# Patient Record
Sex: Male | Born: 1980 | Race: White | Hispanic: No | Smoking: Never smoker
Health system: Southern US, Community
[De-identification: ages and names within clinical notes are randomized; demographics above are authoritative.]

## PROBLEM LIST (undated history)

## (undated) DIAGNOSIS — F32A Depression, unspecified: Secondary | ICD-10-CM

## (undated) DIAGNOSIS — G4730 Sleep apnea, unspecified: Secondary | ICD-10-CM

## (undated) DIAGNOSIS — Z9889 Other specified postprocedural states: Secondary | ICD-10-CM

## (undated) DIAGNOSIS — R002 Palpitations: Secondary | ICD-10-CM

## (undated) HISTORY — PX: MICRODISCECTOMY LUMBAR: SUR864

---

## 2021-01-09 ENCOUNTER — Emergency Department (HOSPITAL_COMMUNITY): Payer: BC Managed Care – PPO | Admitting: Anesthesiology

## 2021-01-09 ENCOUNTER — Encounter (HOSPITAL_COMMUNITY): Admission: EM | Disposition: A | Discharge status: Home / Self Care | Payer: Self-pay | Source: Home / Self Care

## 2021-01-09 ENCOUNTER — Inpatient Hospital Stay: Admit: 2021-01-09 | Payer: Self-pay | Admitting: General Surgery

## 2021-01-09 ENCOUNTER — Emergency Department (HOSPITAL_BASED_OUTPATIENT_CLINIC_OR_DEPARTMENT_OTHER): Payer: BC Managed Care – PPO

## 2021-01-09 ENCOUNTER — Other Ambulatory Visit: Payer: Self-pay

## 2021-01-09 ENCOUNTER — Encounter (HOSPITAL_COMMUNITY): Payer: Self-pay | Admitting: Certified Registered"

## 2021-01-09 ENCOUNTER — Inpatient Hospital Stay (HOSPITAL_BASED_OUTPATIENT_CLINIC_OR_DEPARTMENT_OTHER)
Admission: EM | Admit: 2021-01-09 | Discharge: 2021-01-11 | DRG: 328 | Disposition: A | Discharge status: Home / Self Care | Payer: BC Managed Care – PPO | Attending: Surgery | Admitting: Surgery

## 2021-01-09 ENCOUNTER — Encounter (HOSPITAL_BASED_OUTPATIENT_CLINIC_OR_DEPARTMENT_OTHER): Payer: Self-pay

## 2021-01-09 ENCOUNTER — Inpatient Hospital Stay: Admit: 2021-01-09 | Payer: BC Managed Care – PPO | Admitting: General Surgery

## 2021-01-09 DIAGNOSIS — R109 Unspecified abdominal pain: Secondary | ICD-10-CM | POA: Diagnosis present

## 2021-01-09 DIAGNOSIS — Z79899 Other long term (current) drug therapy: Secondary | ICD-10-CM | POA: Diagnosis not present

## 2021-01-09 DIAGNOSIS — Z20822 Contact with and (suspected) exposure to covid-19: Secondary | ICD-10-CM | POA: Diagnosis present

## 2021-01-09 DIAGNOSIS — R112 Nausea with vomiting, unspecified: Secondary | ICD-10-CM

## 2021-01-09 DIAGNOSIS — K5900 Constipation, unspecified: Principal | ICD-10-CM | POA: Diagnosis present

## 2021-01-09 DIAGNOSIS — K562 Volvulus: Secondary | ICD-10-CM | POA: Diagnosis present

## 2021-01-09 DIAGNOSIS — R1084 Generalized abdominal pain: Secondary | ICD-10-CM

## 2021-01-09 HISTORY — PX: LAPAROSCOPY: SHX197

## 2021-01-09 HISTORY — DX: Other specified postprocedural states: Z98.890

## 2021-01-09 LAB — TYPE AND SCREEN
ABO/RH(D): A POS
Antibody Screen: NEGATIVE

## 2021-01-09 LAB — COMPREHENSIVE METABOLIC PANEL
ALT: 36 U/L (ref 0–44)
AST: 47 U/L — ABNORMAL HIGH (ref 15–41)
Albumin: 4.3 g/dL (ref 3.5–5.0)
Alkaline Phosphatase: 31 U/L — ABNORMAL LOW (ref 38–126)
Anion gap: 12 (ref 5–15)
BUN: 9 mg/dL (ref 6–20)
CO2: 27 mmol/L (ref 22–32)
Calcium: 10 mg/dL (ref 8.9–10.3)
Chloride: 98 mmol/L (ref 98–111)
Creatinine, Ser: 1.09 mg/dL (ref 0.61–1.24)
GFR, Estimated: >60 mL/min (ref >60)
Glucose, Bld: 90 mg/dL (ref 70–99)
Potassium: 4.4 mmol/L (ref 3.5–5.1)
Sodium: 137 mmol/L (ref 135–145)
Total Bilirubin: 0.9 mg/dL (ref 0.3–1.2)
Total Protein: 7.3 g/dL (ref 6.5–8.1)

## 2021-01-09 LAB — CBC
HCT: 51.3 % (ref 39.0–52.0)
HCT: 47.2 % (ref 39.0–52.0)
Hemoglobin: 17.4 g/dL — ABNORMAL HIGH (ref 13.0–17.0)
Hemoglobin: 16.2 g/dL (ref 13.0–17.0)
MCH: 31.8 pg (ref 26.0–34.0)
MCH: 32 pg (ref 26.0–34.0)
MCHC: 33.9 g/dL (ref 30.0–36.0)
MCHC: 34.3 g/dL (ref 30.0–36.0)
MCV: 93.6 fL (ref 80.0–100.0)
MCV: 93.3 fL (ref 80.0–100.0)
Platelets: 285 10*3/uL (ref 150–400)
Platelets: 283 10*3/uL (ref 150–400)
RBC: 5.48 MIL/uL (ref 4.22–5.81)
RBC: 5.06 MIL/uL (ref 4.22–5.81)
RDW: 14.4 % (ref 11.5–15.5)
RDW: 14.3 % (ref 11.5–15.5)
WBC: 9.6 10*3/uL (ref 4.0–10.5)
WBC: 12.5 10*3/uL — ABNORMAL HIGH (ref 4.0–10.5)
nRBC: 0 % (ref 0.0–0.2)
nRBC: 0 % (ref 0.0–0.2)

## 2021-01-09 LAB — CREATININE, SERUM
Creatinine, Ser: 1.17 mg/dL (ref 0.61–1.24)
GFR, Estimated: >60 mL/min (ref >60)

## 2021-01-09 LAB — RESP PANEL BY RT-PCR (FLU A&B, COVID) ARPGX2
Influenza A by PCR: NEGATIVE
Influenza B by PCR: NEGATIVE
SARS Coronavirus 2 by RT PCR: NEGATIVE

## 2021-01-09 LAB — PROTIME-INR
INR: 1 (ref 0.8–1.2)
Prothrombin Time: 13.1 seconds (ref 11.4–15.2)

## 2021-01-09 LAB — TROPONIN I (HIGH SENSITIVITY)
Troponin I (High Sensitivity): 14 ng/L (ref <18)
Troponin I (High Sensitivity): 13 ng/L (ref <18)

## 2021-01-09 LAB — APTT: aPTT: 26 seconds (ref 24–36)

## 2021-01-09 LAB — ABO/RH: ABO/RH(D): A POS

## 2021-01-09 LAB — LIPASE, BLOOD: Lipase: 37 U/L (ref 11–51)

## 2021-01-09 SURGERY — LAPAROTOMY, EXPLORATORY
Anesthesia: General

## 2021-01-09 SURGERY — LAPAROSCOPY, DIAGNOSTIC
Anesthesia: General

## 2021-01-09 MED ORDER — ACETAMINOPHEN 10 MG/ML IV SOLN
INTRAVENOUS | Status: AC
  Filled 2021-01-09: qty 100

## 2021-01-09 MED ORDER — HYDROMORPHONE HCL 1 MG/ML IJ SOLN
1.0000 mg | Freq: Once | INTRAMUSCULAR | Status: AC
  Administered 2021-01-09: 1 mg via INTRAVENOUS
  Filled 2021-01-09: qty 1

## 2021-01-09 MED ORDER — PROPOFOL 10 MG/ML IV BOLUS
INTRAVENOUS | Status: AC
  Filled 2021-01-09: qty 20

## 2021-01-09 MED ORDER — DOCUSATE SODIUM 100 MG PO CAPS
100.0000 mg | ORAL_CAPSULE | Freq: Two times a day (BID) | ORAL | Status: DC
  Administered 2021-01-09 – 2021-01-11 (×4): 100 mg via ORAL
  Filled 2021-01-09 (×4): qty 1

## 2021-01-09 MED ORDER — CEFAZOLIN SODIUM-DEXTROSE 2-3 GM-%(50ML) IV SOLR
INTRAVENOUS | Status: DC | PRN
  Administered 2021-01-09: 2 g via INTRAVENOUS

## 2021-01-09 MED ORDER — FLEET ENEMA 7-19 GM/118ML RE ENEM: 1.0000 | ENEMA | Freq: Once | RECTAL | Status: DC | PRN

## 2021-01-09 MED ORDER — HYDROMORPHONE HCL 1 MG/ML IJ SOLN
INTRAMUSCULAR | Status: AC
  Filled 2021-01-09: qty 1

## 2021-01-09 MED ORDER — LIDOCAINE 2% (20 MG/ML) 5 ML SYRINGE
INTRAMUSCULAR | Status: DC | PRN
  Administered 2021-01-09: 30 mg via INTRAVENOUS

## 2021-01-09 MED ORDER — CEFAZOLIN SODIUM-DEXTROSE 2-4 GM/100ML-% IV SOLN
2.0000 g | Freq: Three times a day (TID) | INTRAVENOUS | Status: AC
  Administered 2021-01-09: 2 g via INTRAVENOUS
  Filled 2021-01-09: qty 100

## 2021-01-09 MED ORDER — POLYETHYLENE GLYCOL 3350 17 G PO PACK
17.0000 g | PACK | Freq: Two times a day (BID) | ORAL | Status: DC
  Administered 2021-01-09 – 2021-01-11 (×4): 17 g via ORAL
  Filled 2021-01-09 (×4): qty 1

## 2021-01-09 MED ORDER — HYDROMORPHONE HCL 1 MG/ML IJ SOLN
0.2500 mg | INTRAMUSCULAR | Status: DC | PRN
  Administered 2021-01-09 (×3): 0.5 mg via INTRAVENOUS

## 2021-01-09 MED ORDER — MIDAZOLAM HCL 2 MG/2ML IJ SOLN
INTRAMUSCULAR | Status: AC
  Filled 2021-01-09: qty 2

## 2021-01-09 MED ORDER — ROCURONIUM BROMIDE 10 MG/ML (PF) SYRINGE
PREFILLED_SYRINGE | INTRAVENOUS | Status: DC | PRN
Start: 2021-01-09 — End: 2021-01-09
  Administered 2021-01-09: 70 mg via INTRAVENOUS

## 2021-01-09 MED ORDER — ONDANSETRON HCL 4 MG/2ML IJ SOLN: 4.0000 mg | Freq: Four times a day (QID) | INTRAMUSCULAR | Status: DC | PRN

## 2021-01-09 MED ORDER — BUPIVACAINE-EPINEPHRINE (PF) 0.25% -1:200000 IJ SOLN
INTRAMUSCULAR | Status: AC
  Filled 2021-01-09: qty 30

## 2021-01-09 MED ORDER — CHLORHEXIDINE GLUCONATE 0.12 % MT SOLN
OROMUCOSAL | Status: AC
  Administered 2021-01-09: 15 mL via OROMUCOSAL
  Filled 2021-01-09: qty 15

## 2021-01-09 MED ORDER — LACTATED RINGERS IV SOLN: INTRAVENOUS | Status: DC

## 2021-01-09 MED ORDER — DIPHENHYDRAMINE HCL 12.5 MG/5ML PO ELIX: 12.5000 mg | ORAL_SOLUTION | Freq: Four times a day (QID) | ORAL | Status: DC | PRN

## 2021-01-09 MED ORDER — ENOXAPARIN SODIUM 40 MG/0.4ML IJ SOSY
40.0000 mg | PREFILLED_SYRINGE | INTRAMUSCULAR | Status: DC
  Administered 2021-01-10 – 2021-01-11 (×2): 40 mg via SUBCUTANEOUS
  Filled 2021-01-09 (×2): qty 0.4

## 2021-01-09 MED ORDER — KETOROLAC TROMETHAMINE 30 MG/ML IJ SOLN
30.0000 mg | Freq: Four times a day (QID) | INTRAMUSCULAR | Status: AC
  Administered 2021-01-09 – 2021-01-10 (×4): 30 mg via INTRAVENOUS
  Filled 2021-01-09 (×4): qty 1

## 2021-01-09 MED ORDER — ONDANSETRON HCL 4 MG/2ML IJ SOLN
INTRAMUSCULAR | Status: DC | PRN
  Administered 2021-01-09: 4 mg via INTRAVENOUS

## 2021-01-09 MED ORDER — PROCHLORPERAZINE MALEATE 10 MG PO TABS
10.0000 mg | ORAL_TABLET | Freq: Four times a day (QID) | ORAL | Status: DC | PRN
  Filled 2021-01-09: qty 1

## 2021-01-09 MED ORDER — FENTANYL CITRATE (PF) 100 MCG/2ML IJ SOLN
INTRAMUSCULAR | Status: AC
  Filled 2021-01-09: qty 2

## 2021-01-09 MED ORDER — ONDANSETRON 4 MG PO TBDP: 4.0000 mg | ORAL_TABLET | Freq: Four times a day (QID) | ORAL | Status: DC | PRN

## 2021-01-09 MED ORDER — FENTANYL CITRATE (PF) 100 MCG/2ML IJ SOLN
100.0000 ug | Freq: Once | INTRAMUSCULAR | Status: AC
  Administered 2021-01-09: 100 ug via INTRAVENOUS
  Filled 2021-01-09: qty 2

## 2021-01-09 MED ORDER — PHENYLEPHRINE 40 MCG/ML (10ML) SYRINGE FOR IV PUSH (FOR BLOOD PRESSURE SUPPORT)
PREFILLED_SYRINGE | INTRAVENOUS | Status: DC | PRN
Start: 2021-01-09 — End: 2021-01-09
  Administered 2021-01-09: 80 ug via INTRAVENOUS
  Administered 2021-01-09: 120 ug via INTRAVENOUS
  Administered 2021-01-09 (×2): 80 ug via INTRAVENOUS

## 2021-01-09 MED ORDER — KETOROLAC TROMETHAMINE 30 MG/ML IJ SOLN
30.0000 mg | Freq: Four times a day (QID) | INTRAMUSCULAR | Status: DC | PRN
Start: 2021-01-10 — End: 2021-01-11
  Administered 2021-01-10 – 2021-01-11 (×2): 30 mg via INTRAVENOUS
  Filled 2021-01-09 (×2): qty 1

## 2021-01-09 MED ORDER — SODIUM CHLORIDE 0.9 % IV BOLUS
1000.0000 mL | Freq: Once | INTRAVENOUS | Status: AC
  Administered 2021-01-09: 1000 mL via INTRAVENOUS

## 2021-01-09 MED ORDER — DIPHENHYDRAMINE HCL 50 MG/ML IJ SOLN: 12.5000 mg | Freq: Four times a day (QID) | INTRAMUSCULAR | Status: DC | PRN

## 2021-01-09 MED ORDER — ACETAMINOPHEN 650 MG RE SUPP: 650.0000 mg | Freq: Four times a day (QID) | RECTAL | Status: DC | PRN

## 2021-01-09 MED ORDER — MIDAZOLAM HCL 2 MG/2ML IJ SOLN
INTRAMUSCULAR | Status: DC | PRN
Start: 2021-01-09 — End: 2021-01-09
  Administered 2021-01-09: 2 mg via INTRAVENOUS

## 2021-01-09 MED ORDER — SUCCINYLCHOLINE CHLORIDE 200 MG/10ML IV SOSY
PREFILLED_SYRINGE | INTRAVENOUS | Status: DC | PRN
Start: 2021-01-09 — End: 2021-01-09
  Administered 2021-01-09: 120 mg via INTRAVENOUS

## 2021-01-09 MED ORDER — IOHEXOL 300 MG/ML  SOLN
100.0000 mL | Freq: Once | INTRAMUSCULAR | Status: AC | PRN
  Administered 2021-01-09: 100 mL via INTRAVENOUS

## 2021-01-09 MED ORDER — DEXAMETHASONE SODIUM PHOSPHATE 10 MG/ML IJ SOLN
INTRAMUSCULAR | Status: DC | PRN
  Administered 2021-01-09: 10 mg via INTRAVENOUS

## 2021-01-09 MED ORDER — METHOCARBAMOL 500 MG PO TABS: 500.0000 mg | ORAL_TABLET | Freq: Four times a day (QID) | ORAL | Status: DC | PRN

## 2021-01-09 MED ORDER — PROCHLORPERAZINE EDISYLATE 10 MG/2ML IJ SOLN: 5.0000 mg | Freq: Four times a day (QID) | INTRAMUSCULAR | Status: DC | PRN

## 2021-01-09 MED ORDER — LIDOCAINE HCL 1 % IJ SOLN
INTRAMUSCULAR | Status: DC | PRN
  Administered 2021-01-09: 9 mL via INTRAMUSCULAR

## 2021-01-09 MED ORDER — PROPOFOL 10 MG/ML IV BOLUS
INTRAVENOUS | Status: DC | PRN
Start: 2021-01-09 — End: 2021-01-09
  Administered 2021-01-09: 200 mg via INTRAVENOUS

## 2021-01-09 MED ORDER — OXYCODONE HCL 5 MG PO TABS
5.0000 mg | ORAL_TABLET | ORAL | Status: DC | PRN
  Administered 2021-01-10: 5 mg via ORAL
  Filled 2021-01-09: qty 1

## 2021-01-09 MED ORDER — ONDANSETRON HCL 4 MG/2ML IJ SOLN
4.0000 mg | Freq: Once | INTRAMUSCULAR | Status: AC
  Administered 2021-01-09: 4 mg via INTRAVENOUS
  Filled 2021-01-09: qty 2

## 2021-01-09 MED ORDER — CEFAZOLIN SODIUM-DEXTROSE 2-4 GM/100ML-% IV SOLN
INTRAVENOUS | Status: AC
  Filled 2021-01-09: qty 100

## 2021-01-09 MED ORDER — FENTANYL CITRATE (PF) 250 MCG/5ML IJ SOLN
INTRAMUSCULAR | Status: AC
  Filled 2021-01-09: qty 5

## 2021-01-09 MED ORDER — PHENYLEPHRINE 40 MCG/ML (10ML) SYRINGE FOR IV PUSH (FOR BLOOD PRESSURE SUPPORT)
PREFILLED_SYRINGE | INTRAVENOUS | Status: AC
  Filled 2021-01-09: qty 10

## 2021-01-09 MED ORDER — TRAMADOL HCL 50 MG PO TABS
50.0000 mg | ORAL_TABLET | Freq: Four times a day (QID) | ORAL | Status: DC | PRN
  Administered 2021-01-10: 50 mg via ORAL
  Filled 2021-01-09 (×2): qty 1

## 2021-01-09 MED ORDER — MELATONIN 3 MG PO TABS: 3.0000 mg | ORAL_TABLET | Freq: Every evening | ORAL | Status: DC | PRN

## 2021-01-09 MED ORDER — ACETAMINOPHEN 10 MG/ML IV SOLN
INTRAVENOUS | Status: DC | PRN
  Administered 2021-01-09: 1000 mg via INTRAVENOUS

## 2021-01-09 MED ORDER — FENTANYL CITRATE PF 50 MCG/ML IJ SOSY
50.0000 ug | PREFILLED_SYRINGE | INTRAMUSCULAR | Status: DC | PRN
  Administered 2021-01-09: 50 ug via INTRAVENOUS
  Filled 2021-01-09: qty 1

## 2021-01-09 MED ORDER — 0.9 % SODIUM CHLORIDE (POUR BTL) OPTIME
TOPICAL | Status: DC | PRN
  Administered 2021-01-09: 1000 mL

## 2021-01-09 MED ORDER — KCL-LACTATED RINGERS-D5W 20 MEQ/L IV SOLN
INTRAVENOUS | Status: DC
  Filled 2021-01-09 (×2): qty 1000

## 2021-01-09 MED ORDER — FENTANYL CITRATE (PF) 250 MCG/5ML IJ SOLN
INTRAMUSCULAR | Status: DC | PRN
  Administered 2021-01-09 (×2): 100 ug via INTRAVENOUS

## 2021-01-09 MED ORDER — ORAL CARE MOUTH RINSE: 15.0000 mL | Freq: Once | OROMUCOSAL | Status: AC

## 2021-01-09 MED ORDER — CHLORHEXIDINE GLUCONATE 0.12 % MT SOLN: 15.0000 mL | Freq: Once | OROMUCOSAL | Status: AC

## 2021-01-09 MED ORDER — MORPHINE SULFATE (PF) 4 MG/ML IV SOLN
4.0000 mg | Freq: Once | INTRAVENOUS | Status: AC
  Administered 2021-01-09: 4 mg via INTRAVENOUS
  Filled 2021-01-09: qty 1

## 2021-01-09 MED ORDER — ACETAMINOPHEN 325 MG PO TABS: 650.0000 mg | ORAL_TABLET | Freq: Four times a day (QID) | ORAL | Status: DC | PRN

## 2021-01-09 MED ORDER — FENTANYL CITRATE PF 50 MCG/ML IJ SOSY
50.0000 ug | PREFILLED_SYRINGE | Freq: Once | INTRAMUSCULAR | Status: AC
  Administered 2021-01-09: 50 ug via INTRAVENOUS
  Filled 2021-01-09: qty 1

## 2021-01-09 MED ORDER — SUGAMMADEX SODIUM 200 MG/2ML IV SOLN
INTRAVENOUS | Status: DC | PRN
Start: 2021-01-09 — End: 2021-01-09
  Administered 2021-01-09: 200 mg via INTRAVENOUS

## 2021-01-09 MED ORDER — SIMETHICONE 80 MG PO CHEW: 40.0000 mg | CHEWABLE_TABLET | Freq: Four times a day (QID) | ORAL | Status: DC | PRN

## 2021-01-09 MED ORDER — LIDOCAINE HCL (PF) 1 % IJ SOLN
INTRAMUSCULAR | Status: AC
  Filled 2021-01-09: qty 30

## 2021-01-09 SURGICAL SUPPLY — 58 items
BAG COUNTER SPONGE SURGICOUNT (BAG) ×2 IMPLANT
BLADE CLIPPER SURG (BLADE) IMPLANT
CANISTER SUCT 3000ML PPV (MISCELLANEOUS) ×2 IMPLANT
CHLORAPREP W/TINT 26 (MISCELLANEOUS) ×2 IMPLANT
COVER SURGICAL LIGHT HANDLE (MISCELLANEOUS) ×2 IMPLANT
DECANTER SPIKE VIAL GLASS SM (MISCELLANEOUS) ×4 IMPLANT
DERMABOND ADVANCED (GAUZE/BANDAGES/DRESSINGS) ×1
DERMABOND ADVANCED .7 DNX12 (GAUZE/BANDAGES/DRESSINGS) ×1 IMPLANT
DRAPE LAPAROSCOPIC ABDOMINAL (DRAPES) ×2 IMPLANT
DRAPE WARM FLUID 44X44 (DRAPES) ×2 IMPLANT
DRSG OPSITE POSTOP 4X10 (GAUZE/BANDAGES/DRESSINGS) IMPLANT
DRSG OPSITE POSTOP 4X8 (GAUZE/BANDAGES/DRESSINGS) IMPLANT
ELECT BLADE 6.5 EXT (BLADE) IMPLANT
ELECT CAUTERY BLADE 6.4 (BLADE) ×5 IMPLANT
ELECT REM PT RETURN 9FT ADLT (ELECTROSURGICAL) ×2
ELECTRODE REM PT RTRN 9FT ADLT (ELECTROSURGICAL) ×1 IMPLANT
GAUZE 4X4 16PLY ~~LOC~~+RFID DBL (SPONGE) ×1 IMPLANT
GLOVE SURG ENC MOIS LTX SZ6 (GLOVE) ×4 IMPLANT
GLOVE SURG UNDER LTX SZ6.5 (GLOVE) ×2 IMPLANT
GOWN STRL REUS W/ TWL LRG LVL3 (GOWN DISPOSABLE) ×2 IMPLANT
GOWN STRL REUS W/TWL 2XL LVL3 (GOWN DISPOSABLE) ×4 IMPLANT
GOWN STRL REUS W/TWL LRG LVL3 (GOWN DISPOSABLE) ×2
HANDLE SUCTION POOLE (INSTRUMENTS) ×1 IMPLANT
KIT BASIN OR (CUSTOM PROCEDURE TRAY) ×2 IMPLANT
KIT TURNOVER KIT B (KITS) ×2 IMPLANT
L-HOOK LAP DISP 36CM (ELECTROSURGICAL) ×2
LHOOK LAP DISP 36CM (ELECTROSURGICAL) ×1 IMPLANT
LIGASURE IMPACT 36 18CM CVD LR (INSTRUMENTS) IMPLANT
NS IRRIG 1000ML POUR BTL (IV SOLUTION) ×4 IMPLANT
PACK GENERAL/GYN (CUSTOM PROCEDURE TRAY) ×2 IMPLANT
PAD ARMBOARD 7.5X6 YLW CONV (MISCELLANEOUS) ×4 IMPLANT
PENCIL BUTTON HOLSTER BLD 10FT (ELECTRODE) ×2 IMPLANT
PENCIL SMOKE EVACUATOR (MISCELLANEOUS) ×2 IMPLANT
SCISSORS LAP 5X35 DISP (ENDOMECHANICALS) IMPLANT
SET IRRIG TUBING LAPAROSCOPIC (IRRIGATION / IRRIGATOR) IMPLANT
SET TUBE SMOKE EVAC HIGH FLOW (TUBING) ×2 IMPLANT
SLEEVE ENDOPATH XCEL 5M (ENDOMECHANICALS) ×2 IMPLANT
SPECIMEN JAR LARGE (MISCELLANEOUS) ×2 IMPLANT
SPONGE T-LAP 18X18 ~~LOC~~+RFID (SPONGE) ×3 IMPLANT
STAPLER VISISTAT 35W (STAPLE) ×2 IMPLANT
SUCTION POOLE HANDLE (INSTRUMENTS) ×2
SUT MNCRL AB 4-0 PS2 18 (SUTURE) ×2 IMPLANT
SUT PDS AB 1 TP1 96 (SUTURE) ×4 IMPLANT
SUT PDS II 0 TP-1 LOOPED 60 (SUTURE) ×4 IMPLANT
SUT VIC AB 2-0 SH 18 (SUTURE) ×2 IMPLANT
SUT VIC AB 3-0 SH 18 (SUTURE) ×2 IMPLANT
SUT VICRYL 4-0 PS2 18IN ABS (SUTURE) IMPLANT
SUT VICRYL AB 2 0 TIES (SUTURE) ×2 IMPLANT
SUT VICRYL AB 3 0 TIES (SUTURE) ×2 IMPLANT
TOWEL GREEN STERILE (TOWEL DISPOSABLE) ×2 IMPLANT
TOWEL GREEN STERILE FF (TOWEL DISPOSABLE) ×2 IMPLANT
TRAY FOLEY MTR SLVR 16FR STAT (SET/KITS/TRAYS/PACK) IMPLANT
TRAY LAPAROSCOPIC MC (CUSTOM PROCEDURE TRAY) ×2 IMPLANT
TROCAR XCEL BLUNT TIP 100MML (ENDOMECHANICALS) IMPLANT
TROCAR XCEL NON-BLD 11X100MML (ENDOMECHANICALS) IMPLANT
TROCAR XCEL NON-BLD 5MMX100MML (ENDOMECHANICALS) ×2 IMPLANT
WARMER LAPAROSCOPE (MISCELLANEOUS) ×2 IMPLANT
YANKAUER SUCT BULB TIP NO VENT (SUCTIONS) ×2 IMPLANT

## 2021-01-09 NOTE — Anesthesia Preprocedure Evaluation (Addendum)
Anesthesia Evaluation  Patient identified by MRN, date of birth, ID band Patient awake    Reviewed: Allergy & Precautions, H&P , NPO status , Patient's Chart, lab work & pertinent test results  Airway Mallampati: II  TM Distance: >3 FB Neck ROM: Full    Dental no notable dental hx. (+) Teeth Intact, Dental Advisory Given   Pulmonary neg pulmonary ROS,    Pulmonary exam normal breath sounds clear to auscultation       Cardiovascular negative cardio ROS   Rhythm:Regular Rate:Normal     Neuro/Psych negative neurological ROS  negative psych ROS   GI/Hepatic negative GI ROS, Neg liver ROS,   Endo/Other  negative endocrine ROS  Renal/GU negative Renal ROS  negative genitourinary   Musculoskeletal   Abdominal   Peds  Hematology negative hematology ROS (+)   Anesthesia Other Findings   Reproductive/Obstetrics negative OB ROS                            Anesthesia Physical Anesthesia Plan  ASA: 1 and emergent  Anesthesia Plan: General   Post-op Pain Management: Ofirmev IV (intra-op)   Induction: Intravenous, Rapid sequence and Cricoid pressure planned  PONV Risk Score and Plan: 3 and Ondansetron, Dexamethasone and Midazolam  Airway Management Planned: Oral ETT  Additional Equipment:   Intra-op Plan:   Post-operative Plan: Extubation in OR  Informed Consent: I have reviewed the patients History and Physical, chart, labs and discussed the procedure including the risks, benefits and alternatives for the proposed anesthesia with the patient or authorized representative who has indicated his/her understanding and acceptance.     Dental advisory given  Plan Discussed with: CRNA  Anesthesia Plan Comments:        Anesthesia Quick Evaluation

## 2021-01-09 NOTE — Op Note (Signed)
PRE-OPERATIVE DIAGNOSIS: small bowel volvulus  POST-OPERATIVE DIAGNOSIS:  abdominal pain with nausea and vomiting  PROCEDURE:  Procedure(s): Diagnostic laparoscopy  SURGEON:  Surgeon(s): Almond Lint, MD  ANESTHESIA:   local and general  DRAINS: none   LOCAL MEDICATIONS USED:  BUPIVICAINE  and LIDOCAINE   SPECIMEN:  No Specimen  DISPOSITION OF SPECIMEN:  N/A  COUNTS:  YES  DICTATION: .Dragon Dictation  PLAN OF CARE: Admit for overnight observation  PATIENT DISPOSITION:  PACU - hemodynamically stable.  FINDINGS:  some larger caliber bowel and some smaller caliber bowel.  No volvulus seen  EBL: min  PROCEDURE:   Patient was identified in the holding area.  He was taken to the operating room and placed supine on the operating room table.  General anesthesia was induced.  A Foley catheter was placed.  His abdomen was prepped and draped in sterile fashion.  A timeout was performed according to the surgical safety checklist.  When all was correct, we continued.  The patient was then placed into reverse Trendelenburg position and rotated to the right.  Local anesthetic was administered at the left costal margin and a 5 mm Optiview port was placed under direct visualization.  Pneumoperitoneum was achieved to a pressure of 15 mmHg.  Reverse Trendelenburg was taken out but the patient was left rotated to the right.  2 additional ports were placed in the far left lateral abdomen with 1 being central and 1 in the left lower quadrant.  The patient was placed into Trendelenburg position and rotated to the left.  Starting at the terminal ileum, the small bowel was run from distal to proximal.  There were no significant adhesions other than right at the terminal ileum as expected.  There is no evidence of a Meckel's diverticulum or small bowel mass.  No intussusception was seen.  There were several areas where the bowel appeared to have a larger caliber and smaller caliber diameter near to each  other.  However, there was no evidence of significant twisting like was seen on the CT scan.  The bowel was run again this time from proximal to distal.  There was again no evidence of malrotation.  There is no evidence of volvulus.  There again were a few areas with changes of caliber in the bowel.  The distal bowel was dilated.  There did appear to be more pastelike material in the distal small intestine as opposed to pure liquid.  There were no other obvious causes of abdominal pain.  The external portions of the colon which were visible were normal.  The liver appeared normal.  Stomach appeared normal.  There was no evidence of obvious abnormality in the pelvis.  A four-quadrant inspection was then performed taking care to look for any evidence of bilious leakage from the small bowel, bleeding, or other evidence of injury.  None was seen.  The pneumoperitoneum was released.  The skin of the incisions was then closed using 4-0 Monocryl in subcuticular fashion.  This was then cleaned, dried, and dressed with Dermabond.  Patient was then allowed to emerge from anesthesia.  He was taken to the PACU in stable condition.  Needle, sponge, and instrument counts were correct per protocol.

## 2021-01-09 NOTE — ED Triage Notes (Signed)
He c/o generalized, worsening abd. Pain and constipation x 2-3 days. He denies fever/vomiting and is in no distress. He does grimace, as if in much pain.

## 2021-01-09 NOTE — ED Provider Notes (Addendum)
[facility] EMERGENCY DEPT Provider Note   CSN: [id number] Arrival date & time: 01/09/21  0825     History  Chief Complaint  Patient presents with   Abdominal Pain    Bruce Morton is a 41 y.o. male.  Patient is a 41 yo male presenting for abdominal pain. Pt admits to abdominal pain that is generalized and severe. Awoke him from sleep. Non radiating. Associated with n/v. Denies rectal bleeding. Denies fevers, chills, recent illness, diarrhea. Denies chest pain or sob.   Hx of back surgeries. Denies intraabdominal surgeries.   Abdominal Pain Associated symptoms: nausea and vomiting   Associated symptoms: no chest pain, no chills, no cough, no dysuria, no fever, no hematuria, no shortness of breath and no sore throat       Home Medications Prior to Admission medications   Not on File      Allergies    Patient has no known allergies.    Review of Systems   Review of Systems  Constitutional:  Negative for chills and fever.  HENT:  Negative for ear pain and sore throat.   Eyes:  Negative for pain and visual disturbance.  Respiratory:  Negative for cough and shortness of breath.   Cardiovascular:  Negative for chest pain and palpitations.  Gastrointestinal:  Positive for abdominal pain, nausea and vomiting.  Genitourinary:  Negative for dysuria and hematuria.  Musculoskeletal:  Negative for arthralgias and back pain.  Skin:  Negative for color change and rash.  Neurological:  Negative for seizures and syncope.  All other systems reviewed and are negative.  Physical Exam Updated Vital Signs BP 136/90 (BP Location: Right Arm)    Pulse 90    Temp 98.5 F (36.9 C) (Oral)    Resp 13    Ht 5\' 9"  (1.753 m)    Wt 90.7 kg    SpO2 99%    BMI 29.53 kg/m  Physical Exam Vitals and nursing note reviewed.  Constitutional:      General: He is in acute distress (secondary to pain).     Appearance: He is well-developed.  HENT:     Head: Normocephalic and  atraumatic.  Eyes:     Conjunctiva/sclera: Conjunctivae normal.  Cardiovascular:     Rate and Rhythm: Normal rate and regular rhythm.     Heart sounds: No murmur heard. Pulmonary:     Effort: Pulmonary effort is normal. No respiratory distress.     Breath sounds: Normal breath sounds.  Abdominal:     Palpations: Abdomen is soft.     Tenderness: There is generalized abdominal tenderness. There is guarding.  Musculoskeletal:        General: No swelling.     Cervical back: Neck supple.  Skin:    General: Skin is warm and dry.     Capillary Refill: Capillary refill takes less than 2 seconds.  Neurological:     Mental Status: He is alert.  Psychiatric:        Mood and Affect: Mood normal.    ED Results / Procedures / Treatments   Labs (all labs ordered are listed, but only abnormal results are displayed) Labs Reviewed  LIPASE, BLOOD  COMPREHENSIVE METABOLIC PANEL  CBC  URINALYSIS, ROUTINE W REFLEX MICROSCOPIC    EKG None  Radiology No results found.  Procedures .Critical Care Performed by: Lianne Cure, DO Authorized by: Lianne Cure, DO   Critical care provider statement:    Critical care time (minutes):  36  Critical care was necessary to treat or prevent imminent or life-threatening deterioration of the following conditions: midgut volvulous, transfer to [facility] OR for surgery.   Critical care was time spent personally by me on the following activities:  Development of treatment plan with patient or surrogate, discussions with consultants, evaluation of patient's response to treatment, examination of patient, ordering and review of laboratory studies, ordering and review of radiographic studies, ordering and performing treatments and interventions, pulse oximetry, re-evaluation of patient's condition and review of old charts Comments:     General surgery    Medications Ordered in ED Medications  ondansetron (ZOFRAN) injection 4 mg (has no administration in time  range)  morphine 4 MG/ML injection 4 mg (has no administration in time range)  sodium chloride 0.9 % bolus 1,000 mL (has no administration in time range)    ED Course/ Medical Decision Making/ A&P                           Medical Decision Making  9:00 AM 41 yo male presenting for severe generalized abdominal pain, nausea, vomiting, that awoke him from sleep. On exam abdomen is tender in all quadrants with guarding. Morphine 4 mg and zofran given. IVF given.  Worsening pain. Dilaudid given.   No s/s sepsis Stable labs CT demonstrates midgut volvulus. I spoke with general surgery at [facility] who recs ED to ED transfer for evaluation  Patient requiring multiple doses of IV narcotics for pain control. Repeat dilaudid and fentanyl given.  10:48 AM Patient accepted by ED physician Dr. Laverta Baltimore. Ambulance called.   10:58 AM Call back from surgery. Requesting patient be taken straight to OR Pt up to date Type and screen, coags ordered    Final Clinical Impression(s) / ED Diagnoses Final diagnoses:  Volvulus (Will)  Generalized abdominal pain  Nausea and vomiting, unspecified vomiting type    Rx / DC Orders ED Discharge Orders     None         Lianne Cure, DO [id number]    Campbell Stall P, DO [id number]    Campbell Stall P, DO [id number]

## 2021-01-09 NOTE — H&P (Signed)
Bruce Morton is an 41 y.o. male.   Chief Complaint: Abdominal pain  HPI:  Pt is a 41 yo M who went to [facility] with sudden onset of abdominal pain that woke him from sleep.  He had no previous illnesses.  He has never had abdominal surgery.  The pain was associated with significant nausea and vomiting.    Pain was generalized to the entire abdomen.  He also had no fevers or chills.  He tried changing positions which was not successful.  He has had issues with constipation.  He is taking miralax and fiber supplements.  He took a large dose of fiber supplements yesterday. He denies diarrhea.  Due to the recent constipation, he tried an enema to see if having a BM would help.    Past Medical History:  Diagnosis Date   H/O nasal septoplasty     Past Surgical History:  Procedure Laterality Date   MICRODISCECTOMY LUMBAR      History reviewed. No pertinent family history. Social History:  reports that he has never smoked. He has never used smokeless tobacco. No history on file for alcohol use and drug use.  Allergies: No Known Allergies  Medications Prior to Admission  Medication Sig Dispense Refill   Cholecalciferol (VITAMIN D3 PO) Take 1 Dose by mouth daily.     Cyanocobalamin (VITAMIN B12 PO) Take 1 Dose by mouth daily.     icosapent Ethyl (VASCEPA) 1 g capsule Take 2 g by mouth 2 (two) times daily.     Multiple Vitamin (MULTIVITAMIN) tablet Take 1 tablet by mouth daily.     OVER THE COUNTER MEDICATION Take 1 Dose by mouth daily. Fiberlyze     TRAZODONE HCL PO Take 1 tablet by mouth at bedtime as needed (sleep).     TRETINOIN EX Apply 1 application topically at bedtime. Apply to face      Results for orders placed or performed during the hospital encounter of 01/09/21 (from the past 48 hour(s))  Lipase, blood     Status: None   Collection Time: 01/09/21  8:47 AM  Result Value Ref Range   Lipase 37 11 - 51 U/L    Comment: Performed at [facility], [address], [place], [place] [address]  Comprehensive metabolic panel     Status: Abnormal   Collection Time: 01/09/21  8:47 AM  Result Value Ref Range   Sodium 137 135 - 145 mmol/L   Potassium 4.4 3.5 - 5.1 mmol/L   Chloride 98 98 - 111 mmol/L   CO2 27 22 - 32 mmol/L   Glucose, Bld 90 70 - 99 mg/dL    Comment: Glucose reference range applies only to samples taken after fasting for at least 8 hours.   BUN 9 6 - 20 mg/dL   Creatinine, Ser 2.80 0.61 - 1.24 mg/dL   Calcium 03.4 8.9 - 91.7 mg/dL   Total Protein 7.3 6.5 - 8.1 g/dL   Albumin 4.3 3.5 - 5.0 g/dL   AST 47 (H) 15 - 41 U/L   ALT 36 0 - 44 U/L   Alkaline Phosphatase 31 (L) 38 - 126 U/L   Total Bilirubin 0.9 0.3 - 1.2 mg/dL   GFR, Estimated >91 >50 mL/min    Comment: (NOTE) Calculated using the CKD-EPI Creatinine Equation (2021)    Anion gap 12 5 - 15    Comment: Performed at [facility], [address] [place], [place] [address]  CBC  Status: Abnormal   Collection Time: 01/09/21  8:47 AM  Result Value Ref Range   WBC 9.6 4.0 - 10.5 K/uL   RBC 5.48 4.22 - 5.81 MIL/uL   Hemoglobin 17.4 (H) 13.0 - 17.0 g/dL   HCT 13.251.3 44.039.0 - 10.252.0 %   MCV 93.6 80.0 - 100.0 fL   MCH 31.8 26.0 - 34.0 pg   MCHC 33.9 30.0 - 36.0 g/dL   RDW 72.514.4 36.611.5 - 44.015.5 %   Platelets 285 150 - 400 K/uL   nRBC 0.0 0.0 - 0.2 %    Comment: Performed at [facility], [address], [place], [place] [address]  Troponin I (High Sensitivity)     Status: None   Collection Time: 01/09/21  9:00 AM  Result Value Ref Range   Troponin I (High Sensitivity) 14 <18 ng/L    Comment: (NOTE) Elevated high sensitivity troponin I (hsTnI) values and significant  changes across serial measurements may suggest ACS but many other  chronic and acute conditions are known to elevate hsTnI results.  Refer to the "Links" section for chest pain algorithms and additional  guidance. Performed at [facility], [address], [place], [place] [address]   Troponin I (High Sensitivity)     Status: None   Collection Time: 01/09/21 10:55 AM  Result Value Ref Range   Troponin I (High Sensitivity) 13 <18 ng/L    Comment: (NOTE) Elevated high sensitivity troponin I (hsTnI) values and significant  changes across serial measurements may suggest ACS but many other  chronic and acute conditions are known to elevate hsTnI results.  Refer to the "Links" section for chest pain algorithms and additional  guidance. Performed at [facility], [address], [place], [place] [address]   Protime-INR     Status: None   Collection Time: 01/09/21 11:06 AM  Result Value Ref Range   Prothrombin Time 13.1 11.4 - 15.2 seconds   INR 1.0 0.8 - 1.2    Comment: (NOTE) INR goal varies based on device and disease states. Performed at [facility], [address], [place], [place] [address]   APTT     Status: None   Collection Time: 01/09/21 11:06 AM  Result Value Ref Range   aPTT 26 24 - 36 seconds    Comment: Performed at [facility], [address], [place], [place] [address]  Resp Panel by RT-PCR (Flu A&B, Covid) Nasopharyngeal Swab     Status: None   Collection Time: 01/09/21 11:24 AM   Specimen: Nasopharyngeal Swab; Nasopharyngeal(NP) swabs in vial transport medium  Result Value Ref Range   SARS Coronavirus 2 by RT PCR NEGATIVE NEGATIVE    Comment: (NOTE) SARS-CoV-2 target nucleic acids are NOT DETECTED.  The SARS-CoV-2 RNA is generally detectable in upper respiratory specimens during the acute phase of infection. The lowest concentration of SARS-CoV-2 viral copies this assay can detect is 138 copies/mL. A negative result does not preclude SARS-Cov-2 infection and should not be used as the sole basis for treatment or other patient management decisions. A negative result may occur with  improper specimen collection/handling,  submission of specimen other than nasopharyngeal swab, presence of viral mutation(s) within the areas targeted by this assay, and inadequate number of viral copies(<138 copies/mL). A negative result must be combined with clinical observations, patient history, and epidemiological information. The expected result is Negative.  Fact Sheet for Patients:  BloggerCourse.comhttps://www.fda.gov/media/152166/download  Fact Sheet for Healthcare Providers:  SeriousBroker.ithttps://www.fda.gov/media/152162/download  This test  is no t yet approved or cleared by the [place] FDA and  has been authorized for detection and/or diagnosis of SARS-CoV-2 by FDA under an Emergency Use Authorization (EUA). This EUA will remain  in effect (meaning this test can be used) for the duration of the COVID-19 declaration under Section 564(b)(1) of the Act, 21 U.S.C.section 360bbb-3(b)(1), unless the authorization is terminated  or revoked sooner.       Influenza A by PCR NEGATIVE NEGATIVE   Influenza B by PCR NEGATIVE NEGATIVE    Comment: (NOTE) The Xpert Xpress SARS-CoV-2/FLU/RSV plus assay is intended as an aid in the diagnosis of influenza from Nasopharyngeal swab specimens and should not be used as a sole basis for treatment. Nasal washings and aspirates are unacceptable for Xpert Xpress SARS-CoV-2/FLU/RSV testing.  Fact Sheet for Patients: BloggerCourse.comhttps://www.fda.gov/media/152166/download  Fact Sheet for Healthcare Providers: SeriousBroker.ithttps://www.fda.gov/media/152162/download  This test is not yet approved or cleared by the [place] FDA and has been authorized for detection and/or diagnosis of SARS-CoV-2 by FDA under an Emergency Use Authorization (EUA). This EUA will remain in effect (meaning this test can be used) for the duration of the COVID-19 declaration under Section 564(b)(1) of the Act, 21 U.S.C. section 360bbb-3(b)(1), unless the authorization is terminated or revoked.  Performed at [facility], [address], [place], [place] [address]   Type and screen [facility]     Status: None   Collection Time: 01/09/21 12:45 PM  Result Value Ref Range   ABO/RH(D) A POS    Antibody Screen NEG    Sample Expiration      01/12/2021,2359 Performed at [facility], [address] [place], [place] [address]   ABO/Rh     Status: None   Collection Time: 01/09/21 12:55 PM  Result Value Ref Range   ABO/RH(D)      A POS Performed at [facility], [address] [place], [place] [address]    CT ABDOMEN PELVIS W CONTRAST  Result Date: 01/09/2021 CLINICAL DATA:  Abdominal pain. EXAM: CT ABDOMEN AND PELVIS WITH CONTRAST TECHNIQUE: Multidetector CT imaging of the abdomen and pelvis was performed using the standard protocol following bolus administration of intravenous contrast. CONTRAST:  100mL OMNIPAQUE IOHEXOL 300 MG/ML  SOLN COMPARISON:  None. FINDINGS: Lower chest: No acute abnormality. Hepatobiliary: No focal liver abnormality is seen. No gallstones, gallbladder wall thickening, or biliary dilatation. Pancreas: No pancreatic ductal dilatation or surrounding inflammatory changes. Spleen: Normal in size without focal abnormality. Adrenals/Urinary Tract: Adrenal glands are unremarkable. Kidneys are normal, without renal calculi, focal lesion, or hydronephrosis. Bladder is unremarkable. Stomach/Bowel: Stomach is within normal limits. Mild, relative central small bowel thickening and mild distention. No discrete transition. Fecalization of the distal small bowel. No evidence of bowel perforation. Central mesenteric whirlpool sign.  See key image. Appendix is not definitively visualized.  Nondilated colon. Vascular/Lymphatic: No enlarged abdominal or pelvic lymph nodes. Reproductive: Prostate is unremarkable. Other: No abdominal wall hernia or abnormality. No abdominopelvic ascites. Musculoskeletal: No acute or significant osseous findings. IMPRESSION: Central small bowel thickening  and distention, in a context of central mesenteric "whirlpool" sign. Findings suspicious for midgut volvulus. No evidence of bowel perforation. These results will be called to the ordering clinician or representative by the Radiologist Assistant, and communication documented in the PACS or [employer] Dashboard. Electronically Signed   By: Roanna BanningJon  Mugweru M.D.   On: 01/09/2021 10:17    Review of Systems  Constitutional: Negative.   HENT:  Negative.    Eyes: Negative.   Respiratory: Negative.    Cardiovascular: Negative.   Gastrointestinal:  Positive for constipation.  Endocrine: Negative.   Genitourinary: Negative.   Musculoskeletal:        Low back pain, chronic level  Skin: Negative.   Allergic/Immunologic: Negative.   Neurological: Negative.   Hematological: Negative.   Psychiatric/Behavioral: Negative.    All other systems reviewed and are negative.   Blood pressure 137/77, pulse 90, temperature 99.1 F (37.3 C), temperature source Oral, resp. rate 17, height 5\' 9"  (1.753 m), weight 90.7 kg, SpO2 98 %. Physical Exam Vitals and nursing note reviewed.  Constitutional:      General: He is in acute distress.     Appearance: He is well-developed. He is not ill-appearing, toxic-appearing or diaphoretic.  Cardiovascular:     Rate and Rhythm: Normal rate and regular rhythm.  Pulmonary:     Effort: Pulmonary effort is normal. No respiratory distress.     Breath sounds: No stridor. Wheezing present.  Chest:     Chest wall: No tenderness.  Abdominal:     General: Abdomen is protuberant. There is no distension. There are no signs of injury.     Palpations: Abdomen is soft. There is no shifting dullness, fluid wave, hepatomegaly, splenomegaly or mass.     Tenderness: There is abdominal tenderness (mild). There is no guarding or rebound. Negative signs include Murphy's sign, Rovsing's sign and McBurney's sign.     Hernia: No hernia is present.  Skin:    General: Skin is warm and dry.      Capillary Refill: Capillary refill takes 2 to 3 seconds.     Coloration: Skin is not cyanotic, jaundiced, mottled or pale.     Findings: No erythema or rash.     Comments: Very tan  Neurological:     General: No focal deficit present.     Mental Status: He is alert. He is disoriented.     Cranial Nerves: No cranial nerve deficit.     Motor: No weakness.  Psychiatric:        Mood and Affect: Mood normal. Mood is not anxious or depressed.        Behavior: Behavior normal.     Assessment/Plan Small bowel volvulus  Plan diagnostic laparoscopy, reduction of volvulus, possible open, possible bowel resection.  Discussed with patient.  Reviewed risks. Discussed that this might recur if I don't find a specific cause of the volvulus.    Reviewed risk of bleeding, infection, damage to adjacent structures, heart or lung complications, blood clot, fistula, hernia, and more.    Will need IV fluids and prophylactic antibiotics. Will need to be admitted post op.     , MD FACS Surgical Oncology, General Surgery, Trauma and Critical [facility], [facility] [place] for weekday/non holidays Check [email or web address] for coverage night/weekend/holidays  Do not use SecureChat as it is not reliable for timely patient care.

## 2021-01-09 NOTE — Transfer of Care (Signed)
Immediate Anesthesia Transfer of Care Note  Patient: Bruce Morton  Procedure(s) Performed: LAPAROSCOPY DIAGNOSTIC  Patient Location: PACU  Anesthesia Type:General  Level of Consciousness: drowsy  Airway & Oxygen Therapy: Patient Spontanous Breathing and Patient connected to face mask oxygen  Post-op Assessment: Report given to RN and Post -op Vital signs reviewed and stable  Post vital signs: Reviewed and stable  Last Vitals:  Vitals Value Taken Time  BP 101/42 01/09/21 1533  Temp 36.8 C 01/09/21 1530  Pulse 74 01/09/21 1537  Resp 13 01/09/21 1537  SpO2 100 % 01/09/21 1537  Vitals shown include unvalidated device data.  Last Pain:  Vitals:   01/09/21 1530  TempSrc:   PainSc: Asleep         Complications: No notable events documented.

## 2021-01-09 NOTE — Progress Notes (Signed)
Pt complaining of the same abdominal pain that brought him to the emergency room pre-op.  Dr. Barry Dienes called and notified.  Orders received.

## 2021-01-09 NOTE — ED Notes (Signed)
Patient transported to CT 

## 2021-01-09 NOTE — Anesthesia Procedure Notes (Signed)
Procedure Name: Intubation Date/Time: 01/09/2021 1:59 PM Performed by: Eligha Bridegroom, CRNA Pre-anesthesia Checklist: Patient identified, Emergency Drugs available, Suction available, Patient being monitored and Timeout performed Patient Re-evaluated:Patient Re-evaluated prior to induction Oxygen Delivery Method: Circle system utilized Preoxygenation: Pre-oxygenation with 100% oxygen Induction Type: IV induction, Rapid sequence and Cricoid Pressure applied Ventilation: Mask ventilation without difficulty Laryngoscope Size: Mac and 4 Grade View: Grade II Tube type: Oral Number of attempts: 1 Airway Equipment and Method: Stylet Placement Confirmation: ETT inserted through vocal cords under direct vision, positive ETCO2 and breath sounds checked- equal and bilateral Secured at: 22 cm Tube secured with: Tape Dental Injury: Teeth and Oropharynx as per pre-operative assessment

## 2021-01-09 NOTE — ED Notes (Signed)
SPO2 while ambulating: 96%; pulse 90.

## 2021-01-09 NOTE — Anesthesia Preprocedure Evaluation (Deleted)
Anesthesia Evaluation  Patient identified by MRN, date of birth, ID band Patient awake    Reviewed: Allergy & Precautions, H&P , NPO status , Patient's Chart, lab work & pertinent test results  Airway        Dental no notable dental hx.    Pulmonary neg pulmonary ROS,    Pulmonary exam normal        Cardiovascular negative cardio ROS       Neuro/Psych negative neurological ROS  negative psych ROS   GI/Hepatic negative GI ROS, Neg liver ROS,   Endo/Other  negative endocrine ROS  Renal/GU negative Renal ROS  negative genitourinary   Musculoskeletal   Abdominal   Peds  Hematology negative hematology ROS (+)   Anesthesia Other Findings   Reproductive/Obstetrics negative OB ROS                             Anesthesia Physical Anesthesia Plan  ASA: 2  Anesthesia Plan: General   Post-op Pain Management: Ofirmev IV (intra-op) and Toradol IV (intra-op)   Induction: Intravenous  PONV Risk Score and Plan: 3 and Ondansetron, Dexamethasone and Midazolam  Airway Management Planned: Oral ETT  Additional Equipment:   Intra-op Plan:   Post-operative Plan: Extubation in OR  Informed Consent: I have reviewed the patients History and Physical, chart, labs and discussed the procedure including the risks, benefits and alternatives for the proposed anesthesia with the patient or authorized representative who has indicated his/her understanding and acceptance.     Dental advisory given  Plan Discussed with: CRNA  Anesthesia Plan Comments:         Anesthesia Quick Evaluation

## 2021-01-10 ENCOUNTER — Encounter (HOSPITAL_COMMUNITY): Payer: Self-pay | Admitting: General Surgery

## 2021-01-10 DIAGNOSIS — R112 Nausea with vomiting, unspecified: Secondary | ICD-10-CM | POA: Diagnosis present

## 2021-01-10 DIAGNOSIS — K562 Volvulus: Secondary | ICD-10-CM | POA: Diagnosis present

## 2021-01-10 DIAGNOSIS — R109 Unspecified abdominal pain: Secondary | ICD-10-CM | POA: Diagnosis present

## 2021-01-10 DIAGNOSIS — Z79899 Other long term (current) drug therapy: Secondary | ICD-10-CM | POA: Diagnosis not present

## 2021-01-10 DIAGNOSIS — Z20822 Contact with and (suspected) exposure to covid-19: Secondary | ICD-10-CM | POA: Diagnosis present

## 2021-01-10 DIAGNOSIS — K5900 Constipation, unspecified: Secondary | ICD-10-CM | POA: Diagnosis present

## 2021-01-10 LAB — CBC
HCT: 43.9 % (ref 39.0–52.0)
Hemoglobin: 14.5 g/dL (ref 13.0–17.0)
MCH: 31.4 pg (ref 26.0–34.0)
MCHC: 33 g/dL (ref 30.0–36.0)
MCV: 95 fL (ref 80.0–100.0)
Platelets: 271 10*3/uL (ref 150–400)
RBC: 4.62 MIL/uL (ref 4.22–5.81)
RDW: 14.4 % (ref 11.5–15.5)
WBC: 10.3 10*3/uL (ref 4.0–10.5)
nRBC: 0 % (ref 0.0–0.2)

## 2021-01-10 LAB — BASIC METABOLIC PANEL
Anion gap: 7 (ref 5–15)
BUN: 14 mg/dL (ref 6–20)
CO2: 28 mmol/L (ref 22–32)
Calcium: 8.9 mg/dL (ref 8.9–10.3)
Chloride: 99 mmol/L (ref 98–111)
Creatinine, Ser: 1.23 mg/dL (ref 0.61–1.24)
GFR, Estimated: >60 mL/min (ref >60)
Glucose, Bld: 127 mg/dL — ABNORMAL HIGH (ref 70–99)
Potassium: 5 mmol/L (ref 3.5–5.1)
Sodium: 134 mmol/L — ABNORMAL LOW (ref 135–145)

## 2021-01-10 MED ORDER — ACETAMINOPHEN 650 MG RE SUPP: 650.0000 mg | Freq: Three times a day (TID) | RECTAL | Status: DC

## 2021-01-10 MED ORDER — ALUM & MAG HYDROXIDE-SIMETH 200-200-20 MG/5ML PO SUSP
30.0000 mL | Freq: Four times a day (QID) | ORAL | Status: DC | PRN
  Administered 2021-01-10 – 2021-01-11 (×2): 30 mL via ORAL
  Filled 2021-01-10 (×2): qty 30

## 2021-01-10 MED ORDER — BISACODYL 10 MG RE SUPP
10.0000 mg | Freq: Once | RECTAL | Status: AC
  Administered 2021-01-10: 10 mg via RECTAL
  Filled 2021-01-10: qty 1

## 2021-01-10 MED ORDER — ACETAMINOPHEN 500 MG PO TABS
1000.0000 mg | ORAL_TABLET | Freq: Three times a day (TID) | ORAL | Status: DC
  Administered 2021-01-10 – 2021-01-11 (×4): 1000 mg via ORAL
  Filled 2021-01-10 (×5): qty 2

## 2021-01-10 MED ORDER — METHOCARBAMOL 500 MG PO TABS
500.0000 mg | ORAL_TABLET | Freq: Four times a day (QID) | ORAL | Status: DC
  Administered 2021-01-10 – 2021-01-11 (×5): 500 mg via ORAL
  Filled 2021-01-10 (×5): qty 1

## 2021-01-10 NOTE — Anesthesia Postprocedure Evaluation (Signed)
Anesthesia Post Note  Patient: Kayveon Lennartz  Procedure(s) Performed: LAPAROSCOPY DIAGNOSTIC     Patient location during evaluation: Other Anesthesia Type: General Level of consciousness: awake and alert Pain management: pain level controlled Vital Signs Assessment: post-procedure vital signs reviewed and stable Respiratory status: spontaneous breathing, nonlabored ventilation and respiratory function stable Cardiovascular status: blood pressure returned to baseline and stable Postop Assessment: no apparent nausea or vomiting Anesthetic complications: no   No notable events documented.  Last Vitals:  Vitals:   01/10/21 0404 01/10/21 0433  BP: (!) 90/43 106/63  Pulse: 76   Resp: 19   Temp: 36.8 C 36.6 C  SpO2: 95%     Last Pain:  Vitals:   01/10/21 0525  TempSrc:   PainSc: 7                  Apryll Hinkle,W. EDMOND

## 2021-01-10 NOTE — Progress Notes (Signed)
Patient ID: Bruce Morton, male   DOB: 11/09/1980, 41 y.o.   MRN: [id number] [facility] Progress Note  1 Day Post-Op  Subjective: CC-  Feels about the same as he did when he came in, but now having some pain at incisions as well. Tolerating clear liquids without any nausea or vomiting. Passing some flatus this morning. Last BM was 2 days ago, typically has 1 BM daily.  Objective: Vital signs in last 24 hours: Temp:  [97.9 F (36.6 C)-99.1 F (37.3 C)] 98.4 F (36.9 C) (01/09 0744) Pulse Rate:  [76-94] 80 (01/09 0744) Resp:  [12-23] 16 (01/09 0744) BP: (90-150)/(40-90) 122/54 (01/09 0744) SpO2:  [94 %-100 %] 95 % (01/09 0744) Weight:  [90.7 kg] 90.7 kg (01/08 0841) Last BM Date: 01/08/21  Intake/Output from previous day: 01/08 0701 - 01/09 0700 In: 3573.7 [P.O.:660; I.V.:2813.7; IV Piggyback:100] Out: 275 [Urine:250; Blood:25] Intake/Output this shift: No intake/output data recorded.  PE: Gen:  Alert, NAD, pleasant Pulm: rate and effort normal on room air Abd: Soft, ND, TTP lower abdomen and right hemiabdomen without peritonitis, +BS, lap incisions cdi  Lab Results:  Recent Labs    01/09/21 2043 01/10/21 0139  WBC 12.5* 10.3  HGB 16.2 14.5  HCT 47.2 43.9  PLT 283 271   BMET Recent Labs    01/09/21 0847 01/09/21 2043 01/10/21 0139  NA 137  --  134*  K 4.4  --  5.0  CL 98  --  99  CO2 27  --  28  GLUCOSE 90  --  127*  BUN 9  --  14  CREATININE 1.09 1.17 1.23  CALCIUM 10.0  --  8.9   PT/INR Recent Labs    01/09/21 1106  LABPROT 13.1  INR 1.0   CMP     Component Value Date/Time   NA 134 (L) 01/10/2021 0139   K 5.0 01/10/2021 0139   CL 99 01/10/2021 0139   CO2 28 01/10/2021 0139   GLUCOSE 127 (H) 01/10/2021 0139   BUN 14 01/10/2021 0139   CREATININE 1.23 01/10/2021 0139   CALCIUM 8.9 01/10/2021 0139   PROT 7.3 01/09/2021 0847   ALBUMIN 4.3 01/09/2021 0847   AST 47 (H) 01/09/2021 0847   ALT 36 01/09/2021 0847   ALKPHOS 31  (L) 01/09/2021 0847   BILITOT 0.9 01/09/2021 0847   GFRNONAA >60 01/10/2021 0139   Lipase     Component Value Date/Time   LIPASE 37 01/09/2021 0847       Studies/Results: CT ABDOMEN PELVIS W CONTRAST  Result Date: 01/09/2021 CLINICAL DATA:  Abdominal pain. EXAM: CT ABDOMEN AND PELVIS WITH CONTRAST TECHNIQUE: Multidetector CT imaging of the abdomen and pelvis was performed using the standard protocol following bolus administration of intravenous contrast. CONTRAST:  OMNIPAQUE IOHEXOL 300 MG/ML  SOLN COMPARISON:  None. FINDINGS: Lower chest: No acute abnormality. Hepatobiliary: No focal liver abnormality is seen. No gallstones, gallbladder wall thickening, or biliary dilatation. Pancreas: No pancreatic ductal dilatation or surrounding inflammatory changes. Spleen: Normal in size without focal abnormality. Adrenals/Urinary Tract: Adrenal glands are unremarkable. Kidneys are normal, without renal calculi, focal lesion, or hydronephrosis. Bladder is unremarkable. Stomach/Bowel: Stomach is within normal limits. Mild, relative central small bowel thickening and mild distention. No discrete transition. Fecalization of the distal small bowel. No evidence of bowel perforation. Central mesenteric whirlpool sign.  See key image. Appendix is not definitively visualized.  Nondilated colon. Vascular/Lymphatic: No enlarged abdominal or pelvic lymph nodes. Reproductive: Prostate is unremarkable.  Other: No abdominal wall hernia or abnormality. No abdominopelvic ascites. Musculoskeletal: No acute or significant osseous findings. IMPRESSION: Central small bowel thickening and distention, in a context of central mesenteric "whirlpool" sign. Findings suspicious for midgut volvulus. No evidence of bowel perforation. These results will be called to the ordering clinician or representative by the Radiologist Assistant, and communication documented in the PACS or [employer]. Electronically Signed   By: Roanna Banning  M.D.   On: 01/09/2021 10:17    Anti-infectives: Anti-infectives (From admission, onward)    Start     Dose/Rate Route Frequency Ordered Stop   01/09/21 2200  ceFAZolin (ANCEF) IVPB 2g/100 mL premix        2 g 200 mL/hr over 30 Minutes Intravenous Every 8 hours 01/09/21 1838 01/09/21 2131        Assessment/Plan POD#1 diagnostic laparoscopy 1/8 Dr. Donell Beers  - findings: some larger caliber bowel and some smaller caliber bowel.  No volvulus seen - Still having abdominal pain. Tolerating clear liquids and passing flatus. Continue colace and miralax BID. Give dulcolax suppository, enema if no success with suppository. Ok for full liquids. Schedule tylenol and robaxin to help limit narcotic need.  ID - ancef periop FEN - d/c IVF, FLD VTE - lovenox Foley - none    LOS: 0 days    Franne Forts, [facility] 01/10/2021, 8:29 AM Please see Amion for pager number during day hours 7:00am-4:30pm

## 2021-01-11 LAB — BASIC METABOLIC PANEL
Anion gap: 4 — ABNORMAL LOW (ref 5–15)
BUN: 12 mg/dL (ref 6–20)
CO2: 29 mmol/L (ref 22–32)
Calcium: 8.6 mg/dL — ABNORMAL LOW (ref 8.9–10.3)
Chloride: 101 mmol/L (ref 98–111)
Creatinine, Ser: 1.11 mg/dL (ref 0.61–1.24)
GFR, Estimated: >60 mL/min (ref >60)
Glucose, Bld: 63 mg/dL — ABNORMAL LOW (ref 70–99)
Potassium: 4.9 mmol/L (ref 3.5–5.1)
Sodium: 134 mmol/L — ABNORMAL LOW (ref 135–145)

## 2021-01-11 MED ORDER — TRAMADOL HCL 50 MG PO TABS: 50.0000 mg | ORAL_TABLET | Freq: Four times a day (QID) | ORAL | 0 refills | Status: DC | PRN

## 2021-01-11 MED ORDER — DOCUSATE SODIUM 100 MG PO CAPS: 100.0000 mg | ORAL_CAPSULE | Freq: Two times a day (BID) | ORAL | 0 refills | Status: DC

## 2021-01-11 MED ORDER — POLYETHYLENE GLYCOL 3350 17 G PO PACK: 17.0000 g | PACK | Freq: Two times a day (BID) | ORAL | 0 refills | Status: DC

## 2021-01-11 MED ORDER — FLEET ENEMA 7-19 GM/118ML RE ENEM: 1.0000 | ENEMA | Freq: Once | RECTAL | Status: DC

## 2021-01-11 MED ORDER — METHOCARBAMOL 500 MG PO TABS: 500.0000 mg | ORAL_TABLET | Freq: Four times a day (QID) | ORAL | 0 refills | Status: DC | PRN

## 2021-01-11 MED ORDER — BISACODYL 10 MG RE SUPP
10.0000 mg | Freq: Once | RECTAL | Status: AC
  Administered 2021-01-11: 10 mg via RECTAL
  Filled 2021-01-11: qty 1

## 2021-01-11 MED ORDER — ACETAMINOPHEN 500 MG PO TABS: 1000.0000 mg | ORAL_TABLET | Freq: Three times a day (TID) | ORAL | 0 refills | Status: DC | PRN

## 2021-01-11 NOTE — Discharge Instructions (Signed)
[facility]  Please arrive at least 30 min before your appointment to complete your check in paperwork.  If you are unable to arrive 30 min prior to your appointment time we may have to cancel or reschedule you. LAPAROSCOPIC SURGERY: POST OP INSTRUCTIONS Always review your discharge instruction sheet given to you by the facility where your surgery was performed. IF YOU HAVE DISABILITY OR FAMILY LEAVE FORMS, YOU MUST BRING THEM TO THE OFFICE FOR PROCESSING.   DO NOT GIVE THEM TO YOUR DOCTOR.  PAIN CONTROL  First take acetaminophen (Tylenol) AND/or ibuprofen (Advil) to control your pain after surgery.  Follow directions on package.  Taking acetaminophen (Tylenol) and/or ibuprofen (Advil) regularly after surgery will help to control your pain and lower the amount of prescription pain medication you may need.  You should not take more than 4,000 mg (4 grams) of acetaminophen (Tylenol) in 24 hours.  You should not take ibuprofen (Advil), aleve, motrin, naprosyn or other NSAIDS if you have a history of stomach ulcers or chronic kidney disease.  A prescription for pain medication may be given to you upon discharge.  Take your pain medication as prescribed, if you still have uncontrolled pain after taking acetaminophen (Tylenol) or ibuprofen (Advil). Use ice packs to help control pain. If you need a refill on your pain medication, please contact your pharmacy.  They will contact our office to request authorization. Prescriptions will not be filled after 5pm or on week-ends.  HOME MEDICATIONS Take your usually prescribed medications unless otherwise directed.  DIET You should follow a light diet the first few days after arrival home.  Be sure to include lots of fluids daily. Avoid fatty, fried foods.   CONSTIPATION It is common to experience some constipation after surgery and if you are taking pain medication.  Increasing fluid intake and taking a stool softener (such as Colace)  will usually help or prevent this problem from occurring.  A mild laxative (Milk of Magnesia or Miralax) should be taken according to package instructions if there are no bowel movements after 48 hours.  WOUND/INCISION CARE Most patients will experience some swelling and bruising in the area of the incisions.  Ice packs will help.  Swelling and bruising can take several days to resolve.  Unless discharge instructions indicate otherwise, follow guidelines below  STERI-STRIPS - you may remove your outer bandages 48 hours after surgery, and you may shower at that time.  You have steri-strips (small skin tapes) in place directly over the incision.  These strips should be left on the skin for 7-10 days.   DERMABOND/SKIN GLUE - you may shower in 24 hours.  The glue will flake off over the next 2-3 weeks. Any sutures or staples will be removed at the office during your follow-up visit.  ACTIVITIES You may resume regular (light) daily activities beginning the next day--such as daily self-care, walking, climbing stairs--gradually increasing activities as tolerated.  You may have sexual intercourse when it is comfortable.  Refrain from any heavy lifting or straining until approved by your doctor. You may drive when you are no longer taking prescription pain medication, you can comfortably wear a seatbelt, and you can safely maneuver your car and apply brakes.  FOLLOW-UP You should see your doctor in the office for a follow-up appointment approximately 2-3 weeks after your surgery.  You should have been given your post-op/follow-up appointment when your surgery was scheduled.  If you did not receive a post-op/follow-up appointment, make sure   that you call for this appointment within a day or two after you arrive home to insure a convenient appointment time.  OTHER INSTRUCTIONS  WHEN TO CALL YOUR DOCTOR: Fever over 101.0 Inability to urinate Continued bleeding from incision. Increased pain, redness, or  drainage from the incision. Increasing abdominal pain  The clinic staff is available to answer your questions during regular business hours.  Please don't hesitate to call and ask to speak to one of the nurses for clinical concerns.  If you have a medical emergency, go to the nearest emergency room or call 911.  A surgeon from [facility] is always on call at the hospital. [address], [address], [place], [place]  [address] ? [address] [address], [place], [place]   [address] [phone] ? [phone] ? FAX [phone]   

## 2021-01-11 NOTE — Progress Notes (Signed)
Patient ID: Bruce Morton, male   DOB: 1980-12-06, 41 y.o.   MRN: [id number] [facility] Progress Note  2 Days Post-Op  Subjective: CC-  Up in chair. Reports some persistent abdominal pain but it is much better than yesterday. Denies n/v. Tolerating full liquids. Passing a lot of flatus, no BM. States that he passed small volume clear loose stool yesterday.  Objective: Vital signs in last 24 hours: Temp:  [97.6 F (36.4 C)-99 F (37.2 C)] 97.6 F (36.4 C) (01/10 0807) Pulse Rate:  [72-80] 72 (01/10 0807) Resp:  [16-18] 18 (01/10 0807) BP: (121-134)/(66-74) 134/66 (01/10 0807) SpO2:  [96 %-97 %] 97 % (01/10 0807) Last BM Date: 01/08/21  Intake/Output from previous day: 01/09 0701 - 01/10 0700 In: 480 [P.O.:480] Out: -  Intake/Output this shift: No intake/output data recorded.  PE: Gen:  Alert, NAD, pleasant Pulm: rate and effort normal on room air Abd: Soft, ND, TTP lower abdomen and right hemiabdomen without peritonitis (improved from yesterday), +BS, lap incisions cdi   Lab Results:  Recent Labs    01/09/21 2043 01/10/21 0139  WBC 12.5* 10.3  HGB 16.2 14.5  HCT 47.2 43.9  PLT 283 271   BMET Recent Labs    01/10/21 0139 01/11/21 0050  NA 134* 134*  K 5.0 4.9  CL 99 101  CO2 28 29  GLUCOSE 127* 63*  BUN 14 12  CREATININE 1.23 1.11  CALCIUM 8.9 8.6*   PT/INR Recent Labs    01/09/21 1106  LABPROT 13.1  INR 1.0   CMP     Component Value Date/Time   NA 134 (L) 01/11/2021 0050   K 4.9 01/11/2021 0050   CL 101 01/11/2021 0050   CO2 29 01/11/2021 0050   GLUCOSE 63 (L) 01/11/2021 0050   BUN 12 01/11/2021 0050   CREATININE 1.11 01/11/2021 0050   CALCIUM 8.6 (L) 01/11/2021 0050   PROT 7.3 01/09/2021 0847   ALBUMIN 4.3 01/09/2021 0847   AST 47 (H) 01/09/2021 0847   ALT 36 01/09/2021 0847   ALKPHOS 31 (L) 01/09/2021 0847   BILITOT 0.9 01/09/2021 0847   GFRNONAA >60 01/11/2021 0050   Lipase     Component Value Date/Time    LIPASE 37 01/09/2021 0847       Studies/Results: CT ABDOMEN PELVIS W CONTRAST  Result Date: 01/09/2021 CLINICAL DATA:  Abdominal pain. EXAM: CT ABDOMEN AND PELVIS WITH CONTRAST TECHNIQUE: Multidetector CT imaging of the abdomen and pelvis was performed using the standard protocol following bolus administration of intravenous contrast. CONTRAST:  OMNIPAQUE IOHEXOL 300 MG/ML  SOLN COMPARISON:  None. FINDINGS: Lower chest: No acute abnormality. Hepatobiliary: No focal liver abnormality is seen. No gallstones, gallbladder wall thickening, or biliary dilatation. Pancreas: No pancreatic ductal dilatation or surrounding inflammatory changes. Spleen: Normal in size without focal abnormality. Adrenals/Urinary Tract: Adrenal glands are unremarkable. Kidneys are normal, without renal calculi, focal lesion, or hydronephrosis. Bladder is unremarkable. Stomach/Bowel: Stomach is within normal limits. Mild, relative central small bowel thickening and mild distention. No discrete transition. Fecalization of the distal small bowel. No evidence of bowel perforation. Central mesenteric whirlpool sign.  See key image. Appendix is not definitively visualized.  Nondilated colon. Vascular/Lymphatic: No enlarged abdominal or pelvic lymph nodes. Reproductive: Prostate is unremarkable. Other: No abdominal wall hernia or abnormality. No abdominopelvic ascites. Musculoskeletal: No acute or significant osseous findings. IMPRESSION: Central small bowel thickening and distention, in a context of central mesenteric "whirlpool" sign. Findings suspicious for midgut volvulus. No  evidence of bowel perforation. These results will be called to the ordering clinician or representative by the Radiologist Assistant, and communication documented in the PACS or [employer]. Electronically Signed   By: Roanna Banning M.D.   On: 01/09/2021 10:17    Anti-infectives: Anti-infectives (From admission, onward)    Start     Dose/Rate Route  Frequency Ordered Stop   01/09/21 2200  ceFAZolin (ANCEF) IVPB 2g/100 mL premix        2 g 200 mL/hr over 30 Minutes Intravenous Every 8 hours 01/09/21 1838 01/09/21 2131        Assessment/Plan POD#2 diagnostic laparoscopy 1/8 Dr. Donell Beers  - findings: some larger caliber bowel and some smaller caliber bowel.  No volvulus seen - Pain improving, passing flatus and tolerating diet but has not had a BM. Continue full liquids and miralax/ colace. Try another dulcolax suppository today, if no success then given enema. Would like for him to have a BM prior to discharge.   ID - ancef periop FEN - FLD VTE - lovenox Foley - none   LOS: 1 day    Franne Forts, [facility] 01/11/2021, 8:15 AM Please see Amion for pager number during day hours 7:00am-4:30pm

## 2021-01-11 NOTE — Discharge Summary (Signed)
°  [facility] Discharge Summary   Patient ID: Bruce Morton MRN: [id number] DOB/AGE: 07-Jun-1980 41 y.o.  Admit date: 01/09/2021 Discharge date: 01/11/2021  Admitting Diagnosis: Abdominal pain with nausea and vomiting Concern for small bowel volvulus Constipation  Discharge Diagnosis No volvulus seen on diagnostic laparoscopy  Consultants None  Imaging: No results found.  Procedures Dr. Donell Beers (01/09/2021) - Diagnostic laparoscopy  Hospital Course:  Bruce Morton is a 41yo male who was transferred from [facility] to [facility] with acute onset abdominal pain.  Workup included CT scan which was concerning for small bowel volvulus.  Patient was admitted and underwent diagnostic laparoscopy. Intraoperatively no volvulus was seen.  Tolerated procedure well and was transferred to the floor.  He was started on a strong bowel regimen. Diet was advanced as tolerated.  Pain resolved and bowel function returned. On POD2, the patient was voiding well, tolerating diet, ambulating well, pain well controlled, vital signs stable, incisions c/d/i and felt stable for discharge home.  Patient will follow up as below and knows to call with questions or concerns.    I have personally reviewed the patients medication history on the [place] controlled substance database.     Allergies as of 01/11/2021   No Known Allergies      Medication List     TAKE these medications    acetaminophen 500 MG tablet Commonly known as: TYLENOL Take 2 tablets (1,000 mg total) by mouth every 8 (eight) hours as needed for mild pain.   docusate sodium 100 MG capsule Commonly known as: COLACE Take 1 capsule (100 mg total) by mouth 2 (two) times daily.   icosapent Ethyl 1 g capsule Commonly known as: VASCEPA Take 2 g by mouth 2 (two) times daily.   methocarbamol 500 MG tablet Commonly known as: ROBAXIN Take 1 tablet (500 mg total) by mouth every 6 (six) hours as needed for muscle  spasms.   multivitamin tablet Take 1 tablet by mouth daily.   OVER THE COUNTER MEDICATION Take 1 Dose by mouth daily. Fiberlyze   polyethylene glycol 17 g packet Commonly known as: MIRALAX / GLYCOLAX Take 17 g by mouth 2 (two) times daily.   traMADol 50 MG tablet Commonly known as: ULTRAM Take 1 tablet (50 mg total) by mouth every 6 (six) hours as needed for moderate pain or severe pain.   TRAZODONE HCL PO Take 1 tablet by mouth at bedtime as needed (sleep).   TRETINOIN EX Apply 1 application topically at bedtime. Apply to face   VITAMIN B12 PO Take 1 Dose by mouth daily.   VITAMIN D3 PO Take 1 Dose by mouth daily.          Follow-up Information     Almond Lint, MD. Call.   Specialty: General Surgery Why: We are working on your appointment, please call to confirm Please arrive 30 minutes prior to your appointment to check in and fill out paperwork. Bring photo ID and insurance information. Contact information: [address] [place] [address] [phone]                   Signed: Franne Forts, [facility] 01/11/2021, 3:22 PM Please see Amion for pager number during day hours 7:00am-4:30pm

## 2021-08-02 ENCOUNTER — Other Ambulatory Visit: Payer: Self-pay | Admitting: Otolaryngology

## 2021-08-16 ENCOUNTER — Encounter (HOSPITAL_BASED_OUTPATIENT_CLINIC_OR_DEPARTMENT_OTHER): Payer: Self-pay | Admitting: Otolaryngology

## 2021-08-16 ENCOUNTER — Other Ambulatory Visit: Payer: Self-pay

## 2021-08-23 ENCOUNTER — Ambulatory Visit (HOSPITAL_BASED_OUTPATIENT_CLINIC_OR_DEPARTMENT_OTHER)
Admission: RE | Admit: 2021-08-23 | Discharge: 2021-08-23 | Disposition: A | Discharge status: Home / Self Care | Payer: BC Managed Care – PPO | Attending: Otolaryngology | Admitting: Otolaryngology

## 2021-08-23 ENCOUNTER — Other Ambulatory Visit: Payer: Self-pay

## 2021-08-23 ENCOUNTER — Encounter (HOSPITAL_BASED_OUTPATIENT_CLINIC_OR_DEPARTMENT_OTHER)
Admission: RE | Disposition: A | Discharge status: Home / Self Care | Payer: Self-pay | Source: Home / Self Care | Attending: Otolaryngology

## 2021-08-23 ENCOUNTER — Encounter (HOSPITAL_BASED_OUTPATIENT_CLINIC_OR_DEPARTMENT_OTHER): Payer: Self-pay | Admitting: Otolaryngology

## 2021-08-23 ENCOUNTER — Ambulatory Visit (HOSPITAL_BASED_OUTPATIENT_CLINIC_OR_DEPARTMENT_OTHER): Payer: BC Managed Care – PPO | Admitting: Certified Registered"

## 2021-08-23 DIAGNOSIS — Z87891 Personal history of nicotine dependence: Secondary | ICD-10-CM | POA: Insufficient documentation

## 2021-08-23 DIAGNOSIS — G4733 Obstructive sleep apnea (adult) (pediatric): Secondary | ICD-10-CM | POA: Insufficient documentation

## 2021-08-23 HISTORY — DX: Depression, unspecified: F32.A

## 2021-08-23 HISTORY — DX: Palpitations: R00.2

## 2021-08-23 HISTORY — DX: Sleep apnea, unspecified: G47.30

## 2021-08-23 HISTORY — PX: DRUG INDUCED ENDOSCOPY: SHX6808

## 2021-08-23 SURGERY — DRUG INDUCED SLEEP ENDOSCOPY
Anesthesia: Monitor Anesthesia Care | Site: Nose | Laterality: Right

## 2021-08-23 MED ORDER — FENTANYL CITRATE (PF) 100 MCG/2ML IJ SOLN: 25.0000 ug | INTRAMUSCULAR | Status: DC | PRN

## 2021-08-23 MED ORDER — PROMETHAZINE HCL 25 MG/ML IJ SOLN: 6.2500 mg | INTRAMUSCULAR | Status: DC | PRN

## 2021-08-23 MED ORDER — PROPOFOL 500 MG/50ML IV EMUL
INTRAVENOUS | Status: DC | PRN
  Administered 2021-08-23: 100 ug/kg/min via INTRAVENOUS

## 2021-08-23 MED ORDER — KETOROLAC TROMETHAMINE 30 MG/ML IJ SOLN: 30.0000 mg | Freq: Once | INTRAMUSCULAR | Status: DC

## 2021-08-23 MED ORDER — LACTATED RINGERS IV SOLN: INTRAVENOUS | Status: DC

## 2021-08-23 MED ORDER — OXYMETAZOLINE HCL 0.05 % NA SOLN
NASAL | Status: DC | PRN
  Administered 2021-08-23: 1 via TOPICAL

## 2021-08-23 MED ORDER — OXYCODONE HCL 5 MG PO TABS: 5.0000 mg | ORAL_TABLET | Freq: Once | ORAL | Status: DC | PRN

## 2021-08-23 MED ORDER — AMISULPRIDE (ANTIEMETIC) 5 MG/2ML IV SOLN
10.0000 mg | Freq: Once | INTRAVENOUS | Status: DC | PRN
Start: 2021-08-23 — End: 2021-08-23

## 2021-08-23 MED ORDER — ACETAMINOPHEN 10 MG/ML IV SOLN: 1000.0000 mg | Freq: Once | INTRAVENOUS | Status: DC | PRN

## 2021-08-23 MED ORDER — OXYCODONE HCL 5 MG/5ML PO SOLN: 5.0000 mg | Freq: Once | ORAL | Status: DC | PRN

## 2021-08-23 MED ORDER — OXYMETAZOLINE HCL 0.05 % NA SOLN
NASAL | Status: AC
  Filled 2021-08-23: qty 30

## 2021-08-23 SURGICAL SUPPLY — 14 items
CANISTER SUCT 1200ML W/VALVE (MISCELLANEOUS) ×1 IMPLANT
GLOVE BIO SURGEON STRL SZ7.5 (GLOVE) ×1 IMPLANT
GLOVE BIOGEL PI IND STRL 7.0 (GLOVE) IMPLANT
GLOVE BIOGEL PI INDICATOR 7.0 (GLOVE) ×1
KIT CLEAN ENDO (MISCELLANEOUS) ×1 IMPLANT
NDL HYPO 27GX1-1/4 (NEEDLE) IMPLANT
NEEDLE HYPO 27GX1-1/4 (NEEDLE) IMPLANT
PATTIES SURGICAL .5 X3 (DISPOSABLE) ×1 IMPLANT
SHEET MEDIUM DRAPE 40X70 STRL (DRAPES) ×1 IMPLANT
SOL ANTI FOG 6CC (MISCELLANEOUS) ×1 IMPLANT
SOLUTION ANTI FOG 6CC (MISCELLANEOUS) ×1
SYR CONTROL 10ML LL (SYRINGE) IMPLANT
TOWEL GREEN STERILE FF (TOWEL DISPOSABLE) ×1 IMPLANT
TUBE CONNECTING 20X1/4 (TUBING) ×1 IMPLANT

## 2021-08-23 NOTE — Anesthesia Preprocedure Evaluation (Addendum)
Anesthesia Evaluation  Patient identified by MRN, date of birth, ID band Patient awake    Reviewed: Allergy & Precautions, NPO status , Patient's Chart, lab work & pertinent test results  Airway Mallampati: III  TM Distance: >3 FB Neck ROM: Full    Dental no notable dental hx.    Pulmonary sleep apnea and Continuous Positive Airway Pressure Ventilation , Patient abstained from smoking.,    Pulmonary exam normal        Cardiovascular negative cardio ROS Normal cardiovascular exam     Neuro/Psych PSYCHIATRIC DISORDERS Depression negative neurological ROS     GI/Hepatic negative GI ROS, Neg liver ROS,   Endo/Other  negative endocrine ROS  Renal/GU negative Renal ROS     Musculoskeletal negative musculoskeletal ROS (+)   Abdominal   Peds  Hematology negative hematology ROS (+)   Anesthesia Other Findings Obstructive Sleep Apnea  Reproductive/Obstetrics                            Anesthesia Physical Anesthesia Plan  ASA: 2  Anesthesia Plan: MAC   Post-op Pain Management:    Induction: Intravenous  PONV Risk Score and Plan: 1 and Propofol infusion and Treatment may vary due to age or medical condition  Airway Management Planned: Natural Airway  Additional Equipment:   Intra-op Plan:   Post-operative Plan:   Informed Consent: I have reviewed the patients History and Physical, chart, labs and discussed the procedure including the risks, benefits and alternatives for the proposed anesthesia with the patient or authorized representative who has indicated his/her understanding and acceptance.     Dental advisory given  Plan Discussed with: CRNA  Anesthesia Plan Comments:         Anesthesia Quick Evaluation

## 2021-08-23 NOTE — Op Note (Signed)
Preop diagnosis: Obstructive sleep apnea Postop diagnosis: same Procedure: Drug-induced sleep endoscopy Surgeon: Jenne Pane Anesth: IV sedation Compl: None Findings: There is 50% anterior-posterior and 50% lateral wall collapse at the velum making him a candidate for hypoglossal nerve stimulator placement.  There was more circumferential collapse at the level of the tongue base. Description:  After discussing risks, benefits, and alternatives, the patient was brought to the operative suite and placed on the operative table in the supine position.  Anesthesia was induced and the patient was given light sedation to simulate natural sleep. When the proper level was reached, an Afrin-soaked pledget was placed in the right nasal passage for a couple of minutes and then removed.  The fiberoptic laryngoscope was then passed to view the pharynx and larynx.  Findings are noted above and the exam was recorded.  After completion, the scope was removed and the patient was returned to anesthesia for wakeup and was moved to the recovery room in stable condition.

## 2021-08-23 NOTE — H&P (Signed)
Bruce Morton is an 41 y.o. male.   Chief Complaint: Sleep apnea HPI: 41 year old male with obstructive sleep apnea who has not been able to tolerate CPAP.  Past Medical History:  Diagnosis Date   Depression    H/O nasal septoplasty    Palpitations    Sleep apnea     Past Surgical History:  Procedure Laterality Date   LAPAROSCOPY N/A 01/09/2021   Procedure: LAPAROSCOPY DIAGNOSTIC;  Surgeon: Almond Lint, MD;  Location: [facility] OR;  Service: General;  Laterality: N/A;   MICRODISCECTOMY LUMBAR      History reviewed. No pertinent family history. Social History:  reports that he has never smoked. He has never used smokeless tobacco. He reports current alcohol use. He reports that he does not use drugs.  Allergies: No Known Allergies  Medications Prior to Admission  Medication Sig Dispense Refill   buPROPion (WELLBUTRIN XL) 300 MG 24 hr tablet Take 300 mg by mouth daily.     Cholecalciferol (VITAMIN D3 PO) Take 1 Dose by mouth daily.     Cyanocobalamin (VITAMIN B12 PO) Take 1 Dose by mouth daily.     Multiple Vitamin (MULTIVITAMIN) tablet Take 1 tablet by mouth daily.     TRETINOIN EX Apply 1 application topically at bedtime. Apply to face     TRAZODONE HCL PO Take 1 tablet by mouth at bedtime as needed (sleep).      No results found for this or any previous visit (from the past 48 hour(s)). No results found.  Review of Systems  All other systems reviewed and are negative.   Blood pressure (!) 139/92, pulse 89, temperature 98.4 F (36.9 C), temperature source Oral, resp. rate 15, height 5\' 9"  (1.753 m), weight 92.5 kg, SpO2 99 %. Physical Exam Constitutional:      Appearance: Normal appearance. He is normal weight.  HENT:     Head: Normocephalic and atraumatic.     Right Ear: External ear normal.     Left Ear: External ear normal.     Nose: Nose normal.     Mouth/Throat:     Mouth: Mucous membranes are moist.     Pharynx: Oropharynx is clear.  Eyes:      Extraocular Movements: Extraocular movements intact.     Conjunctiva/sclera: Conjunctivae normal.     Pupils: Pupils are equal, round, and reactive to light.  Cardiovascular:     Rate and Rhythm: Normal rate.  Pulmonary:     Effort: Pulmonary effort is normal.  Musculoskeletal:        General: Normal range of motion.     Cervical back: Normal range of motion.  Skin:    General: Skin is warm and dry.  Neurological:     General: No focal deficit present.     Mental Status: He is alert and oriented to person, place, and time.  Psychiatric:        Mood and Affect: Mood normal.        Behavior: Behavior normal.        Thought Content: Thought content normal.        Judgment: Judgment normal.      Assessment/Plan Obstructive sleep apnea, BMI 30.11  To OR for sleep endoscopy.  , MD 08/23/2021, 12:11 PM

## 2021-08-23 NOTE — Anesthesia Postprocedure Evaluation (Signed)
Anesthesia Post Note  Patient: Bruce Morton  Procedure(s) Performed: DRUG INDUCED ENDOSCOPY (Right: Nose)     Patient location during evaluation: PACU Anesthesia Type: MAC Level of consciousness: awake Pain management: pain level controlled Vital Signs Assessment: post-procedure vital signs reviewed and stable Respiratory status: spontaneous breathing, nonlabored ventilation, respiratory function stable and patient connected to nasal cannula oxygen Cardiovascular status: stable and blood pressure returned to baseline Postop Assessment: no apparent nausea or vomiting Anesthetic complications: no   No notable events documented.  Last Vitals:  Vitals:   08/23/21 1308 08/23/21 1322  BP: 114/65 104/72  Pulse: 79 73  Resp: 14 16  Temp:  37 C  SpO2: 94% 95%    Last Pain:  Vitals:   08/23/21 1322  TempSrc:   PainSc: 0-No pain                 [name]

## 2021-08-23 NOTE — Transfer of Care (Signed)
Immediate Anesthesia Transfer of Care Note  Patient: [name]  Procedure(s) Performed: DRUG INDUCED ENDOSCOPY (Right: Nose)  Patient Location: PACU  Anesthesia Type:MAC  Level of Consciousness: awake, drowsy and patient cooperative  Airway & Oxygen Therapy: Patient Spontanous Breathing and Patient connected to face mask oxygen  Post-op Assessment: Report given to RN and Post -op Vital signs reviewed and stable  Post vital signs: Reviewed and stable  Last Vitals:  Vitals Value Taken Time  BP    Temp    Pulse 71 08/23/21 1255  Resp    SpO2 97 % 08/23/21 1255  Vitals shown include unvalidated device data.  Last Pain:  Vitals:   08/23/21 1052  TempSrc: Oral  PainSc: 0-No pain      Patients Stated Pain Goal: 5 (16/10/96 0454)  Complications: No notable events documented.

## 2021-08-23 NOTE — Discharge Instructions (Signed)

## 2021-08-23 NOTE — Brief Op Note (Signed)
08/23/2021  12:49 PM  PATIENT:  Jeri Cos  41 y.o. male  PRE-OPERATIVE DIAGNOSIS:  Obstructive Sleep Apnea BMI 30.0-30.9,adult  POST-OPERATIVE DIAGNOSIS:  Obstructive Sleep Apnea BMI 30.0-30.9,adult  PROCEDURE:  Procedure(s): DRUG INDUCED ENDOSCOPY (Right)  SURGEON:  Surgeon(s) and Role:    Christia Reading, MD - Primary  PHYSICIAN ASSISTANT:   ASSISTANTS: none   ANESTHESIA:   IV sedation  EBL:  None   BLOOD ADMINISTERED:none  DRAINS: none   LOCAL MEDICATIONS USED:  NONE  SPECIMEN:  No Specimen  DISPOSITION OF SPECIMEN:  N/A  COUNTS:  YES  TOURNIQUET:  * No tourniquets in log *  DICTATION: .Note written in EPIC  PLAN OF CARE: Discharge to home after PACU  PATIENT DISPOSITION:  PACU - hemodynamically stable.   Delay start of Pharmacological VTE agent (>24hrs) due to surgical blood loss or risk of bleeding: no

## 2021-08-24 ENCOUNTER — Encounter (HOSPITAL_BASED_OUTPATIENT_CLINIC_OR_DEPARTMENT_OTHER): Payer: Self-pay | Admitting: Otolaryngology

## 2021-09-06 DIAGNOSIS — S39012A Strain of muscle, fascia and tendon of lower back, initial encounter: Secondary | ICD-10-CM

## 2021-09-06 HISTORY — DX: Strain of muscle, fascia and tendon of lower back, initial encounter: S39.012A

## 2021-09-14 ENCOUNTER — Other Ambulatory Visit: Payer: Self-pay | Admitting: Otolaryngology

## 2021-09-15 ENCOUNTER — Other Ambulatory Visit: Payer: Self-pay

## 2021-09-15 ENCOUNTER — Encounter (HOSPITAL_BASED_OUTPATIENT_CLINIC_OR_DEPARTMENT_OTHER): Payer: Self-pay | Admitting: Otolaryngology

## 2021-09-21 ENCOUNTER — Ambulatory Visit (HOSPITAL_BASED_OUTPATIENT_CLINIC_OR_DEPARTMENT_OTHER)
Admission: RE | Admit: 2021-09-21 | Discharge: 2021-09-21 | Disposition: A | Discharge status: Home / Self Care | Payer: BC Managed Care – PPO | Attending: Otolaryngology | Admitting: Otolaryngology

## 2021-09-21 ENCOUNTER — Other Ambulatory Visit: Payer: Self-pay

## 2021-09-21 ENCOUNTER — Ambulatory Visit (HOSPITAL_BASED_OUTPATIENT_CLINIC_OR_DEPARTMENT_OTHER): Payer: BC Managed Care – PPO | Admitting: Certified Registered"

## 2021-09-21 ENCOUNTER — Encounter (HOSPITAL_BASED_OUTPATIENT_CLINIC_OR_DEPARTMENT_OTHER)
Admission: RE | Disposition: A | Discharge status: Home / Self Care | Payer: Self-pay | Source: Home / Self Care | Attending: Otolaryngology

## 2021-09-21 ENCOUNTER — Ambulatory Visit (HOSPITAL_COMMUNITY): Payer: BC Managed Care – PPO

## 2021-09-21 ENCOUNTER — Encounter (HOSPITAL_BASED_OUTPATIENT_CLINIC_OR_DEPARTMENT_OTHER): Payer: Self-pay | Admitting: Otolaryngology

## 2021-09-21 DIAGNOSIS — G4733 Obstructive sleep apnea (adult) (pediatric): Secondary | ICD-10-CM | POA: Diagnosis present

## 2021-09-21 DIAGNOSIS — F32A Depression, unspecified: Secondary | ICD-10-CM | POA: Diagnosis not present

## 2021-09-21 HISTORY — PX: IMPLANTATION OF HYPOGLOSSAL NERVE STIMULATOR: SHX6827

## 2021-09-21 SURGERY — INSERTION, HYPOGLOSSAL NERVE STIMULATOR
Anesthesia: General | Site: Chest | Laterality: Right

## 2021-09-21 MED ORDER — FENTANYL CITRATE (PF) 100 MCG/2ML IJ SOLN
INTRAMUSCULAR | Status: AC
  Filled 2021-09-21: qty 2

## 2021-09-21 MED ORDER — MIDAZOLAM HCL 2 MG/2ML IJ SOLN
INTRAMUSCULAR | Status: AC
  Filled 2021-09-21: qty 2

## 2021-09-21 MED ORDER — 0.9 % SODIUM CHLORIDE (POUR BTL) OPTIME
TOPICAL | Status: DC | PRN
  Administered 2021-09-21: 200 mL

## 2021-09-21 MED ORDER — SUCCINYLCHOLINE CHLORIDE 200 MG/10ML IV SOSY
PREFILLED_SYRINGE | INTRAVENOUS | Status: AC
  Filled 2021-09-21: qty 10

## 2021-09-21 MED ORDER — DEXAMETHASONE SODIUM PHOSPHATE 4 MG/ML IJ SOLN
INTRAMUSCULAR | Status: DC | PRN
  Administered 2021-09-21: 5 mg via INTRAVENOUS

## 2021-09-21 MED ORDER — ONDANSETRON HCL 4 MG/2ML IJ SOLN
INTRAMUSCULAR | Status: DC | PRN
  Administered 2021-09-21: 4 mg via INTRAVENOUS

## 2021-09-21 MED ORDER — HYDROCODONE-ACETAMINOPHEN 5-325 MG PO TABS: 1.0000 | ORAL_TABLET | Freq: Four times a day (QID) | ORAL | 0 refills | Status: AC | PRN

## 2021-09-21 MED ORDER — LIDOCAINE HCL (CARDIAC) PF 100 MG/5ML IV SOSY
PREFILLED_SYRINGE | INTRAVENOUS | Status: DC | PRN
  Administered 2021-09-21: 60 mg via INTRAVENOUS

## 2021-09-21 MED ORDER — SUCCINYLCHOLINE CHLORIDE 200 MG/10ML IV SOSY
PREFILLED_SYRINGE | INTRAVENOUS | Status: DC | PRN
  Administered 2021-09-21: 120 mg via INTRAVENOUS

## 2021-09-21 MED ORDER — LIDOCAINE-EPINEPHRINE 1 %-1:100000 IJ SOLN
INTRAMUSCULAR | Status: DC | PRN
  Administered 2021-09-21: 5 mL

## 2021-09-21 MED ORDER — PROPOFOL 500 MG/50ML IV EMUL
INTRAVENOUS | Status: DC | PRN
  Administered 2021-09-21: 35 ug/kg/min via INTRAVENOUS

## 2021-09-21 MED ORDER — PHENYLEPHRINE HCL-NACL 20-0.9 MG/250ML-% IV SOLN
INTRAVENOUS | Status: DC | PRN
  Administered 2021-09-21: 40 ug/min via INTRAVENOUS

## 2021-09-21 MED ORDER — PHENYLEPHRINE HCL (PRESSORS) 10 MG/ML IV SOLN
INTRAVENOUS | Status: AC
  Filled 2021-09-21: qty 1

## 2021-09-21 MED ORDER — DEXAMETHASONE SODIUM PHOSPHATE 10 MG/ML IJ SOLN
INTRAMUSCULAR | Status: AC
  Filled 2021-09-21: qty 1

## 2021-09-21 MED ORDER — OXYCODONE HCL 5 MG/5ML PO SOLN: 5.0000 mg | Freq: Once | ORAL | Status: DC | PRN

## 2021-09-21 MED ORDER — PROPOFOL 10 MG/ML IV BOLUS
INTRAVENOUS | Status: DC | PRN
  Administered 2021-09-21: 200 mg via INTRAVENOUS

## 2021-09-21 MED ORDER — PHENYLEPHRINE 80 MCG/ML (10ML) SYRINGE FOR IV PUSH (FOR BLOOD PRESSURE SUPPORT)
PREFILLED_SYRINGE | INTRAVENOUS | Status: AC
  Filled 2021-09-21: qty 20

## 2021-09-21 MED ORDER — ONDANSETRON HCL 4 MG/2ML IJ SOLN: 4.0000 mg | Freq: Once | INTRAMUSCULAR | Status: DC | PRN

## 2021-09-21 MED ORDER — DEXMEDETOMIDINE HCL IN NACL 80 MCG/20ML IV SOLN
INTRAVENOUS | Status: AC
  Filled 2021-09-21: qty 40

## 2021-09-21 MED ORDER — DEXMEDETOMIDINE HCL IN NACL 80 MCG/20ML IV SOLN
INTRAVENOUS | Status: DC | PRN
  Administered 2021-09-21: 8 ug via BUCCAL

## 2021-09-21 MED ORDER — LIDOCAINE 2% (20 MG/ML) 5 ML SYRINGE
INTRAMUSCULAR | Status: AC
  Filled 2021-09-21: qty 15

## 2021-09-21 MED ORDER — LACTATED RINGERS IV SOLN
INTRAVENOUS | Status: DC
Start: 2021-09-21 — End: 2021-09-21

## 2021-09-21 MED ORDER — ONDANSETRON HCL 4 MG/2ML IJ SOLN
INTRAMUSCULAR | Status: AC
  Filled 2021-09-21: qty 8

## 2021-09-21 MED ORDER — PROPOFOL 500 MG/50ML IV EMUL
INTRAVENOUS | Status: AC
  Filled 2021-09-21: qty 100

## 2021-09-21 MED ORDER — OXYCODONE HCL 5 MG PO TABS: 5.0000 mg | ORAL_TABLET | Freq: Once | ORAL | Status: DC | PRN

## 2021-09-21 MED ORDER — FENTANYL CITRATE (PF) 100 MCG/2ML IJ SOLN
INTRAMUSCULAR | Status: DC | PRN
  Administered 2021-09-21 (×2): 50 ug via INTRAVENOUS

## 2021-09-21 MED ORDER — FENTANYL CITRATE (PF) 100 MCG/2ML IJ SOLN
25.0000 ug | INTRAMUSCULAR | Status: DC | PRN
  Administered 2021-09-21: 50 ug via INTRAVENOUS
  Administered 2021-09-21: 25 ug via INTRAVENOUS

## 2021-09-21 MED ORDER — MIDAZOLAM HCL 5 MG/5ML IJ SOLN
INTRAMUSCULAR | Status: DC | PRN
  Administered 2021-09-21: 2 mg via INTRAVENOUS

## 2021-09-21 SURGICAL SUPPLY — 67 items
ACC NRSTM 4 TRQ WRNCH STRL (MISCELLANEOUS)
ADH SKN CLS APL DERMABOND .7 (GAUZE/BANDAGES/DRESSINGS) ×2
BLADE CLIPPER SURG (BLADE) IMPLANT
BLADE SURG 15 STRL LF DISP TIS (BLADE) ×1 IMPLANT
BLADE SURG 15 STRL SS (BLADE) ×1
CANISTER SUCT 1200ML W/VALVE (MISCELLANEOUS) ×1 IMPLANT
CORD BIPOLAR FORCEPS 12FT (ELECTRODE) ×1 IMPLANT
COVER PROBE W GEL 5X96 (DRAPES) ×1 IMPLANT
DERMABOND ADVANCED .7 DNX12 (GAUZE/BANDAGES/DRESSINGS) ×2 IMPLANT
DRAPE C-ARM 35X43 STRL (DRAPES) ×1 IMPLANT
DRAPE HEAD BAR (DRAPES) IMPLANT
DRAPE INCISE IOBAN 66X45 STRL (DRAPES) ×1 IMPLANT
DRAPE MICROSCOPE WILD 40.5X102 (DRAPES) ×1 IMPLANT
DRAPE UTILITY XL STRL (DRAPES) ×1 IMPLANT
DRSG TEGADERM 2-3/8X2-3/4 SM (GAUZE/BANDAGES/DRESSINGS) ×2 IMPLANT
DRSG TEGADERM 4X4.75 (GAUZE/BANDAGES/DRESSINGS) IMPLANT
ELECT COATED BLADE 2.86 ST (ELECTRODE) ×1 IMPLANT
ELECT EMG 18 NIMS (NEUROSURGERY SUPPLIES) ×1
ELECT REM PT RETURN 9FT ADLT (ELECTROSURGICAL) ×1
ELECTRODE EMG 18 NIMS (NEUROSURGERY SUPPLIES) ×1 IMPLANT
ELECTRODE REM PT RTRN 9FT ADLT (ELECTROSURGICAL) ×1 IMPLANT
FORCEPS BIPOLAR SPETZLER 8 1.0 (NEUROSURGERY SUPPLIES) ×1 IMPLANT
GAUZE 4X4 16PLY ~~LOC~~+RFID DBL (SPONGE) ×1 IMPLANT
GAUZE SPONGE 4X4 12PLY STRL (GAUZE/BANDAGES/DRESSINGS) ×1 IMPLANT
GENERATOR PULSE INSPIRE (Generator) ×1 IMPLANT
GENERATOR PULSE INSPIRE IV (Generator) ×1 IMPLANT
GLOVE BIO SURGEON STRL SZ 6.5 (GLOVE) IMPLANT
GLOVE BIO SURGEON STRL SZ7.5 (GLOVE) ×1 IMPLANT
GLOVE BIOGEL PI IND STRL 7.0 (GLOVE) IMPLANT
GLOVE SURG SS PI 7.0 STRL IVOR (GLOVE) IMPLANT
GOWN STRL REUS W/ TWL LRG LVL3 (GOWN DISPOSABLE) ×3 IMPLANT
GOWN STRL REUS W/TWL LRG LVL3 (GOWN DISPOSABLE) ×3
IV CATH 18G SAFETY (IV SOLUTION) ×1 IMPLANT
KIT NEURO ACCESSORY W/WRENCH (MISCELLANEOUS) IMPLANT
LEAD SENSING RESP INSPIRE (Lead) ×1 IMPLANT
LEAD SENSING RESP INSPIRE IV (Lead) ×1 IMPLANT
LEAD SLEEP STIM INSPIRE IV/V (Lead) ×1 IMPLANT
LEAD SLEEP STIMULATION INSPIRE (Lead) ×1 IMPLANT
LOOP VESSEL MAXI BLUE (MISCELLANEOUS) ×1 IMPLANT
LOOP VESSEL MINI RED (MISCELLANEOUS) ×1 IMPLANT
MARKER SKIN DUAL TIP RULER LAB (MISCELLANEOUS) ×1 IMPLANT
NDL HYPO 25X1 1.5 SAFETY (NEEDLE) ×1 IMPLANT
NEEDLE HYPO 25X1 1.5 SAFETY (NEEDLE) ×1 IMPLANT
NS IRRIG 1000ML POUR BTL (IV SOLUTION) ×1 IMPLANT
PACK BASIN DAY SURGERY FS (CUSTOM PROCEDURE TRAY) ×1 IMPLANT
PACK ENT DAY SURGERY (CUSTOM PROCEDURE TRAY) ×1 IMPLANT
PASSER CATH 36 CODMAN DISP (NEUROSURGERY SUPPLIES) IMPLANT
PASSER CATH 38CM DISP (INSTRUMENTS) IMPLANT
PENCIL SMOKE EVACUATOR (MISCELLANEOUS) ×1 IMPLANT
PROBE NERVE STIMULATOR (NEUROSURGERY SUPPLIES) ×1 IMPLANT
REMOTE CONTROL SLEEP INSPIRE (MISCELLANEOUS) ×1 IMPLANT
SET WALTER ACTIVATION W/DRAPE (SET/KITS/TRAYS/PACK) ×1 IMPLANT
SLEEVE SCD COMPRESS KNEE MED (STOCKING) ×1 IMPLANT
SPONGE INTESTINAL PEANUT (DISPOSABLE) ×1 IMPLANT
SUT SILK 2 0 SH (SUTURE) ×1 IMPLANT
SUT SILK 3 0 REEL (SUTURE) ×1 IMPLANT
SUT SILK 3 0 SH 30 (SUTURE) ×1 IMPLANT
SUT SILK 3-0 (SUTURE) ×2
SUT SILK 3-0 RB1 30XBRD (SUTURE) ×2
SUT VIC AB 3-0 SH 27 (SUTURE) ×2
SUT VIC AB 3-0 SH 27X BRD (SUTURE) ×1 IMPLANT
SUT VIC AB 4-0 PS2 27 (SUTURE) ×1 IMPLANT
SUT VICRYL 4-0 PS2 18IN ABS (SUTURE) IMPLANT
SUTURE SILK 3-0 RB1 30XBRD (SUTURE) ×2 IMPLANT
SYR 10ML LL (SYRINGE) ×1 IMPLANT
SYR BULB EAR ULCER 3OZ GRN STR (SYRINGE) ×1 IMPLANT
TOWEL GREEN STERILE FF (TOWEL DISPOSABLE) ×2 IMPLANT

## 2021-09-21 NOTE — H&P (Signed)
Bruce Morton is an 41 y.o. male.   Chief Complaint: Sleep apnea HPI: 41 year old male with obstructive sleep apnea who has been unable to tolerate CPAP.  Past Medical History:  Diagnosis Date   Back strain 09/06/2021   Depression    H/O nasal septoplasty    Palpitations    Sleep apnea     Past Surgical History:  Procedure Laterality Date   DRUG INDUCED ENDOSCOPY Right 08/23/2021   Procedure: DRUG INDUCED ENDOSCOPY;  Surgeon: Christia Reading, MD;  Location: [facility];  Service: ENT;  Laterality: Right;   LAPAROSCOPY N/A 01/09/2021   Procedure: LAPAROSCOPY DIAGNOSTIC;  Surgeon: Almond Lint, MD;  Location: [facility] OR;  Service: General;  Laterality: N/A;   MICRODISCECTOMY LUMBAR      History reviewed. No pertinent family history. Social History:  reports that he has never smoked. He has never used smokeless tobacco. He reports current alcohol use. He reports that he does not use drugs.  Allergies: No Known Allergies  Medications Prior to Admission  Medication Sig Dispense Refill   buPROPion (WELLBUTRIN XL) 300 MG 24 hr tablet Take 300 mg by mouth daily.     Cholecalciferol (VITAMIN D3 PO) Take 1 Dose by mouth daily.     Cyanocobalamin (VITAMIN B12 PO) Take 1 Dose by mouth daily.     ibuprofen (ADVIL) 400 MG tablet Take 400 mg by mouth every 6 (six) hours as needed for moderate pain.     methylPREDNISolone (MEDROL DOSEPAK) 4 MG TBPK tablet Take by mouth.     Multiple Vitamin (MULTIVITAMIN) tablet Take 1 tablet by mouth daily.     TRETINOIN EX Apply 1 application topically at bedtime. Apply to face     TRAZODONE HCL PO Take 1 tablet by mouth at bedtime as needed (sleep).      No results found for this or any previous visit (from the past 48 hour(s)). No results found.  Review of Systems  All other systems reviewed and are negative.   Blood pressure 129/75, pulse 80, temperature 99.1 F (37.3 C), temperature source Oral, resp. rate 18, height 5\' 9"  (1.753  m), weight 94.6 kg, SpO2 98 %. Physical Exam Constitutional:      Appearance: Normal appearance. He is normal weight.  HENT:     Head: Normocephalic and atraumatic.     Right Ear: External ear normal.     Left Ear: External ear normal.     Nose: Nose normal.     Mouth/Throat:     Mouth: Mucous membranes are moist.     Pharynx: Oropharynx is clear.  Eyes:     Extraocular Movements: Extraocular movements intact.     Conjunctiva/sclera: Conjunctivae normal.     Pupils: Pupils are equal, round, and reactive to light.  Cardiovascular:     Rate and Rhythm: Normal rate.  Pulmonary:     Effort: Pulmonary effort is normal.  Musculoskeletal:     Cervical back: Normal range of motion.  Skin:    General: Skin is warm and dry.  Neurological:     General: No focal deficit present.     Mental Status: He is alert and oriented to person, place, and time.  Psychiatric:        Mood and Affect: Mood normal.        Behavior: Behavior normal.        Thought Content: Thought content normal.        Judgment: Judgment normal.  Assessment/Plan Obstructive sleep apnea and BMI 30.80.  To OR for hypoglossal nerve stimulator placement.  Melida Quitter, MD 09/21/2021, 9:09 AM

## 2021-09-21 NOTE — Anesthesia Postprocedure Evaluation (Signed)
Anesthesia Post Note  Patient: Bruce Morton  Procedure(s) Performed: IMPLANTATION OF HYPOGLOSSAL NERVE STIMULATOR (Right: Chest)     Patient location during evaluation: PACU Anesthesia Type: General Level of consciousness: awake and alert and oriented Pain management: pain level controlled Vital Signs Assessment: post-procedure vital signs reviewed and stable Respiratory status: spontaneous breathing, nonlabored ventilation and respiratory function stable Cardiovascular status: blood pressure returned to baseline and stable Postop Assessment: no apparent nausea or vomiting Anesthetic complications: yes Comments: Had some right chest wall discomfort post op. Given Fentanyl for discomfort.   Encounter Notable Events  Notable Event Outcome Phase Comment  Difficult to intubate - expected  Intraprocedure Filed from anesthesia note documentation.    Last Vitals:  Vitals:   09/21/21 1215 09/21/21 1230  BP: (!) 114/97 131/77  Pulse: 87 85  Resp: 13 14  Temp:    SpO2: 99% 96%    Last Pain:  Vitals:   09/21/21 1230  TempSrc:   PainSc: 7                  Bruce Bungert A.

## 2021-09-21 NOTE — Anesthesia Preprocedure Evaluation (Addendum)
Anesthesia Evaluation  Patient identified by MRN, date of birth, ID band Patient awake    Reviewed: Allergy & Precautions, NPO status , Patient's Chart, lab work & pertinent test results, reviewed documented beta blocker date and time   Airway Mallampati: III  TM Distance: >3 FB Neck ROM: Full    Dental no notable dental hx. 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, LAFB   Neuro/Psych PSYCHIATRIC DISORDERS Depression  Neuromuscular disease    GI/Hepatic negative GI ROS, Neg liver ROS,   Endo/Other  Obesity  Renal/GU negative Renal ROS  negative genitourinary   Musculoskeletal Hx/o back strain   Abdominal (+) + obese,   Peds  Hematology negative hematology ROS (+)   Anesthesia Other Findings   Reproductive/Obstetrics                            Anesthesia Physical Anesthesia Plan  ASA: 2  Anesthesia Plan: General   Post-op Pain Management: Minimal or no pain anticipated, Dilaudid IV, Precedex and Tylenol PO (pre-op)*   Induction: Intravenous  PONV Risk Score and Plan: 4 or greater and Treatment may vary due to age or medical condition, Ondansetron, Midazolam and Dexamethasone  Airway Management Planned: Oral ETT and LMA  Additional Equipment: None  Intra-op Plan:   Post-operative Plan: Extubation in OR  Informed Consent: I have reviewed the patients History and Physical, chart, labs and discussed the procedure including the risks, benefits and alternatives for the proposed anesthesia with the patient or authorized representative who has indicated his/her understanding and acceptance.     Dental advisory  given  Plan Discussed with: CRNA and Anesthesiologist  Anesthesia Plan Comments:         Anesthesia Quick Evaluation

## 2021-09-21 NOTE — Brief Op Note (Signed)
09/21/2021  11:15 AM  PATIENT:  Bruce Morton  41 y.o. male  PRE-OPERATIVE DIAGNOSIS:  Obstructive Sleep Apnea BMI 30.0-30.9,adult  POST-OPERATIVE DIAGNOSIS:  Obstructive Sleep Apnea BMI 30.0-30.9,adult  PROCEDURE:  Procedure(s): IMPLANTATION OF HYPOGLOSSAL NERVE STIMULATOR (Right)  SURGEON:  Surgeon(s) and Role:    Melida Quitter, MD - Primary  PHYSICIAN ASSISTANT:   ASSISTANTS: none   ANESTHESIA:   general  EBL:  15 mL   BLOOD ADMINISTERED:none  DRAINS: none   LOCAL MEDICATIONS USED:  LIDOCAINE   SPECIMEN:  No Specimen  DISPOSITION OF SPECIMEN:  N/A  COUNTS:  YES  TOURNIQUET:  * No tourniquets in log *  DICTATION: .Note written in EPIC  PLAN OF CARE: Discharge to home after PACU  PATIENT DISPOSITION:  PACU - hemodynamically stable.   Delay start of Pharmacological VTE agent (>24hrs) due to surgical blood loss or risk of bleeding: no

## 2021-09-21 NOTE — Op Note (Signed)

## 2021-09-21 NOTE — Anesthesia Procedure Notes (Signed)
Procedure Name: Intubation Date/Time: 09/21/2021 9:25 AM  Performed by: Jaquesha Boroff, Ernesta Amble, CRNAPre-anesthesia Checklist: Patient identified, Emergency Drugs available, Suction available and Patient being monitored Patient Re-evaluated:Patient Re-evaluated prior to induction Oxygen Delivery Method: Circle system utilized Preoxygenation: Pre-oxygenation with 100% oxygen Induction Type: IV induction Ventilation: Mask ventilation without difficulty Laryngoscope Size: Glidescope Grade View: Grade I Tube type: Oral Tube size: 7.5 mm Number of attempts: 1 Airway Equipment and Method: Stylet and Oral airway Placement Confirmation: ETT inserted through vocal cords under direct vision, positive ETCO2 and breath sounds checked- equal and bilateral Secured at: 22 cm Tube secured with: Tape Dental Injury: Teeth and Oropharynx as per pre-operative assessment  Difficulty Due To: Difficulty was anticipated

## 2021-09-21 NOTE — Transfer of Care (Signed)
Immediate Anesthesia Transfer of Care Note  Patient: Bruce Morton  Procedure(s) Performed: IMPLANTATION OF HYPOGLOSSAL NERVE STIMULATOR (Right: Chest)  Patient Location: PACU  Anesthesia Type:General  Level of Consciousness: drowsy and patient cooperative  Airway & Oxygen Therapy: Patient Spontanous Breathing and Patient connected to face mask oxygen  Post-op Assessment: Report given to RN and Post -op Vital signs reviewed and stable  Post vital signs: Reviewed and stable  Last Vitals:  Vitals Value Taken Time  BP 111/46 09/21/21 1134  Temp    Pulse 76 09/21/21 1137  Resp 10 09/21/21 1137  SpO2 95 % 09/21/21 1137  Vitals shown include unvalidated device data.  Last Pain:  Vitals:   09/21/21 0755  TempSrc: Oral  PainSc: 7       Patients Stated Pain Goal: 7 (29/24/46 2863)  Complications:  Encounter Notable Events  Notable Event Outcome Phase Comment  Difficult to intubate - expected  Intraprocedure Filed from anesthesia note documentation.

## 2021-09-21 NOTE — Discharge Instructions (Signed)

## 2021-09-22 ENCOUNTER — Encounter (HOSPITAL_BASED_OUTPATIENT_CLINIC_OR_DEPARTMENT_OTHER): Payer: Self-pay | Admitting: Otolaryngology

## 2023-06-09 IMAGING — CT CT ABD-PELV W/ CM
2 of 5 series · 16 of 46 positions shown, 18 images · IV contrast (APPLIED)
Comparison: None.

CLINICAL DATA: Abdominal pain.

EXAM:
CT ABDOMEN AND PELVIS WITH CONTRAST
TECHNIQUE: Multidetector CT imaging of the abdomen and pelvis was performed
using the standard protocol following bolus administration of
intravenous contrast.
CONTRAST:  100mL OMNIPAQUE IOHEXOL 300 MG/ML  SOLN

[Series 2: abd pel w · axial · 0.77mm/px · z∈[-516,-61]mm · 13 of 103 slices shown, 15 images]
[im 6/103  soft-tissue]
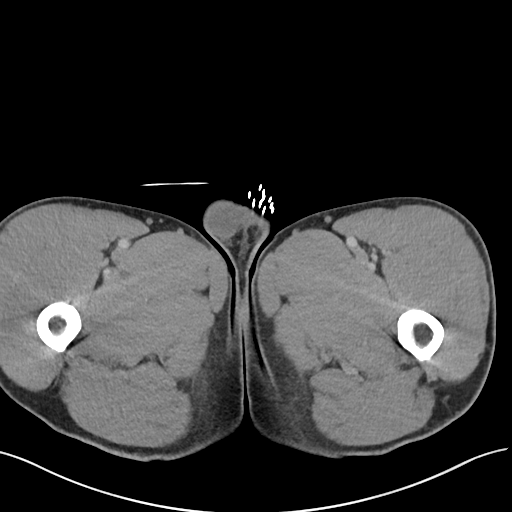
[im 6/103  bone]
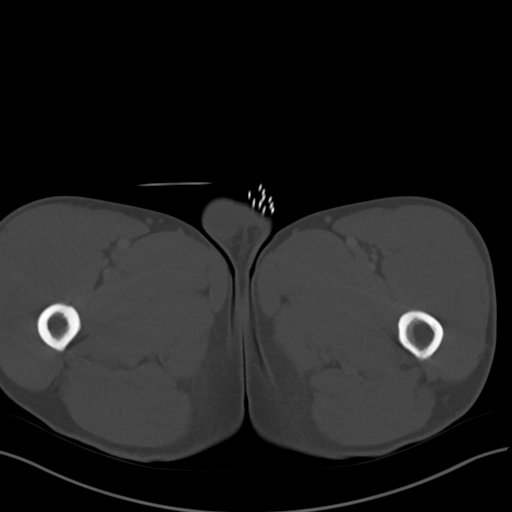
[im 16/103  soft-tissue]
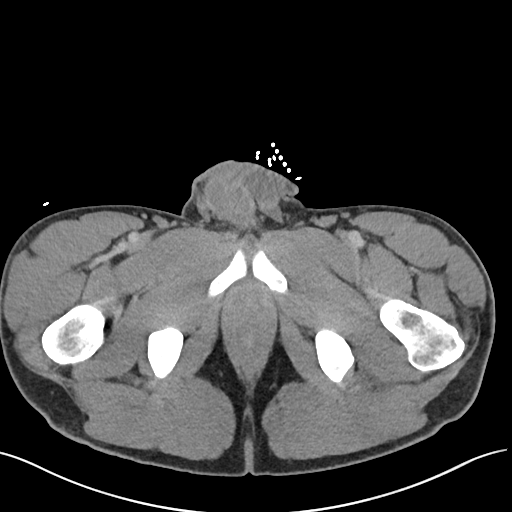
[im 21/103  soft-tissue]
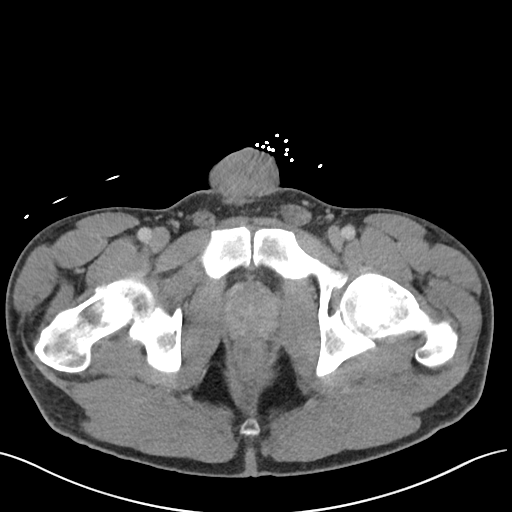
[im 31/103  soft-tissue]
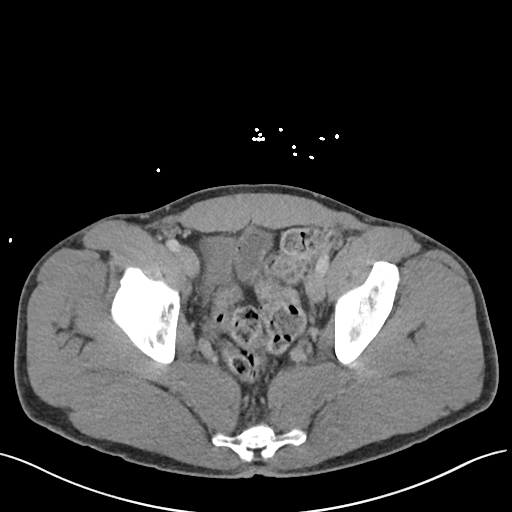
[im 36/103  soft-tissue]
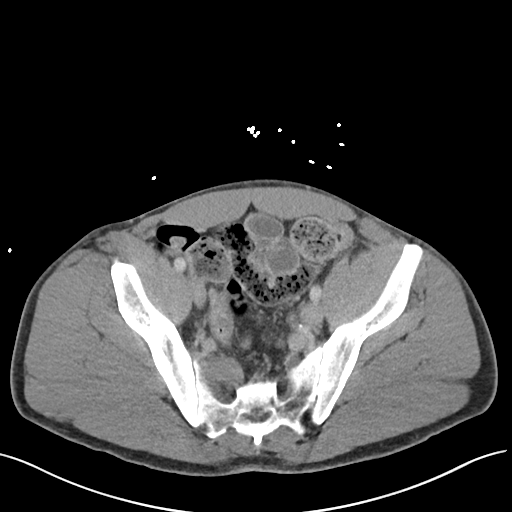
[im 46/103  soft-tissue]
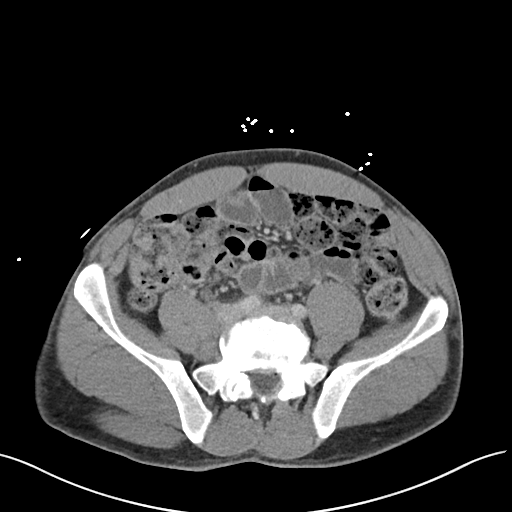
[im 52/103  soft-tissue]
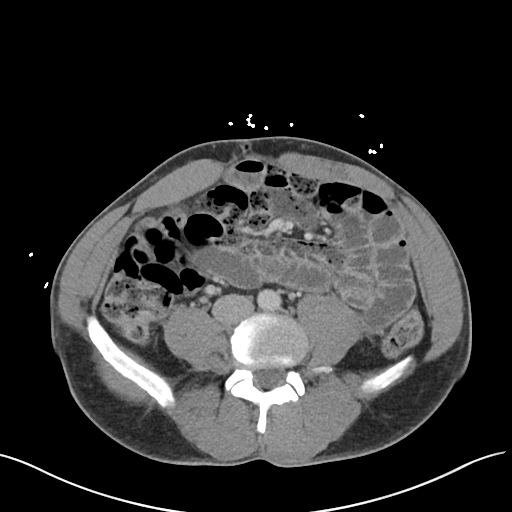
[im 57/103  soft-tissue]
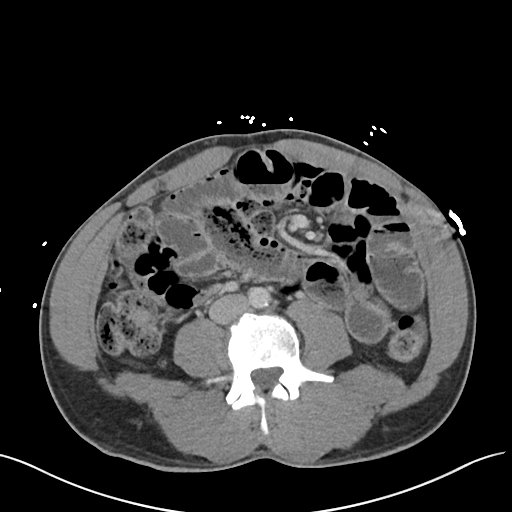
[im 67/103  soft-tissue]
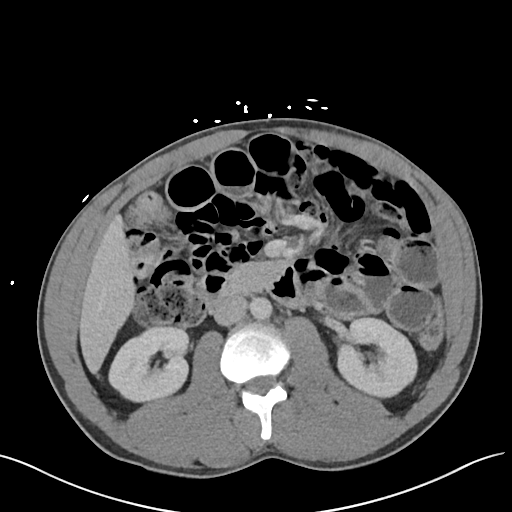
[im 67/103  bone]
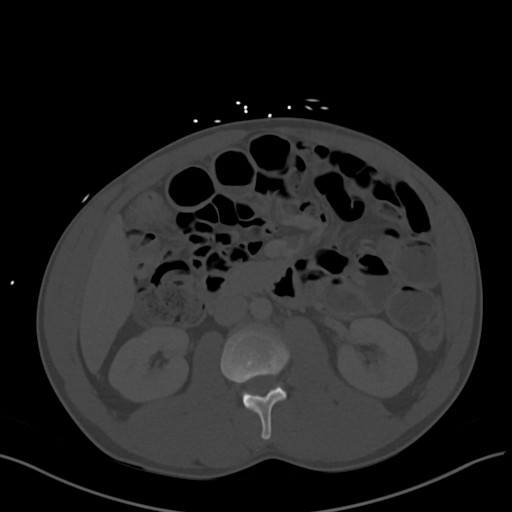
[im 72/103  soft-tissue]
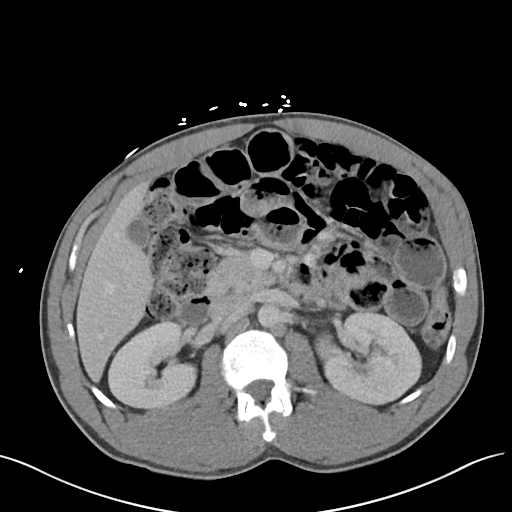
[im 82/103  soft-tissue]
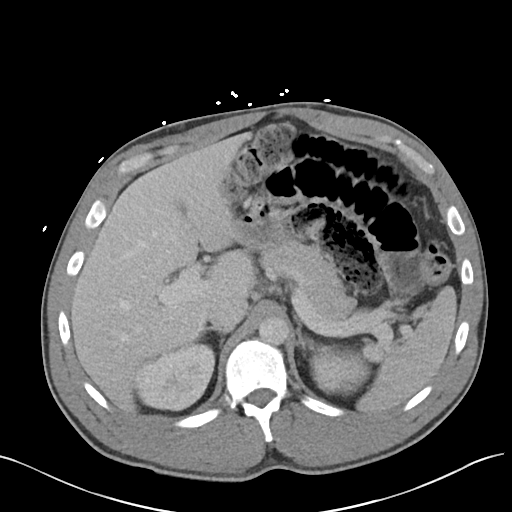
[im 87/103  soft-tissue]
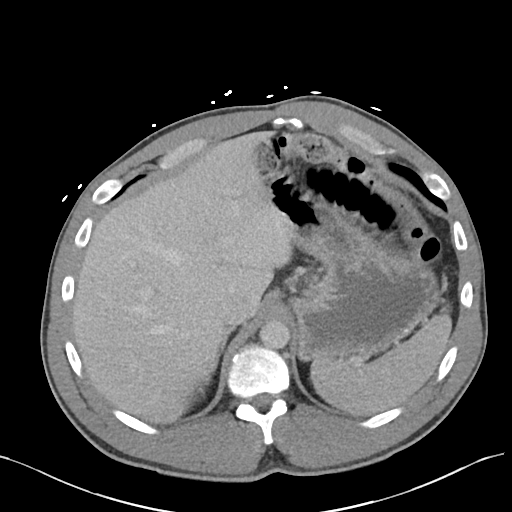
[im 97/103  soft-tissue]
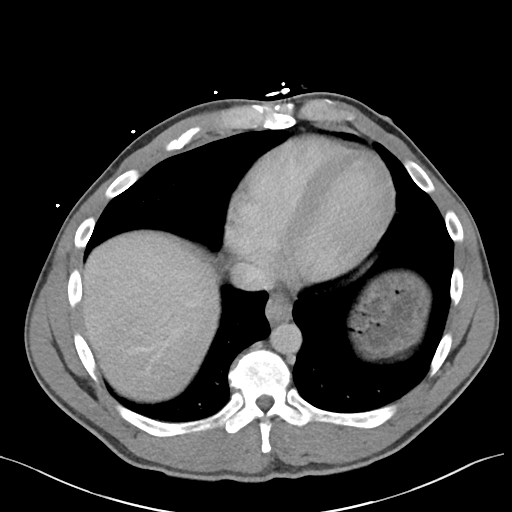

[Series 5: coronal · coronal · 0.79mm/px · 3 of 101 slices shown]
[im 34/101  soft-tissue]
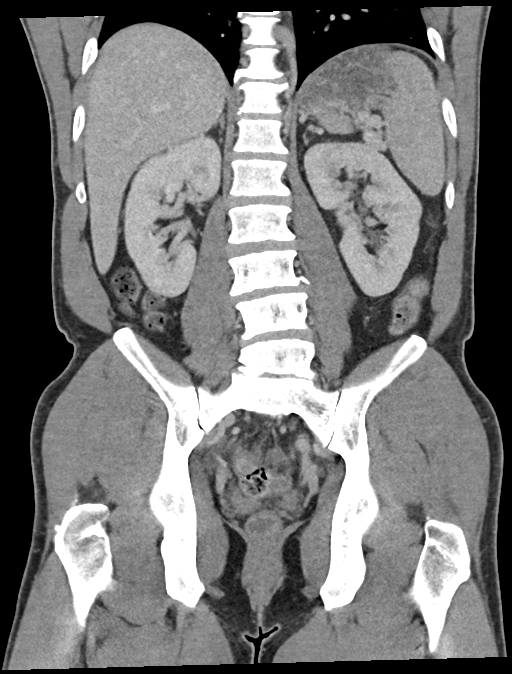
[im 45/101  soft-tissue]
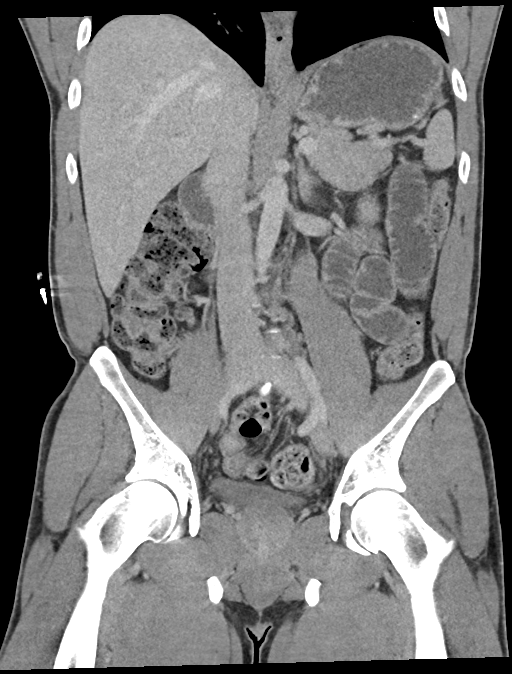
[im 56/101  soft-tissue]
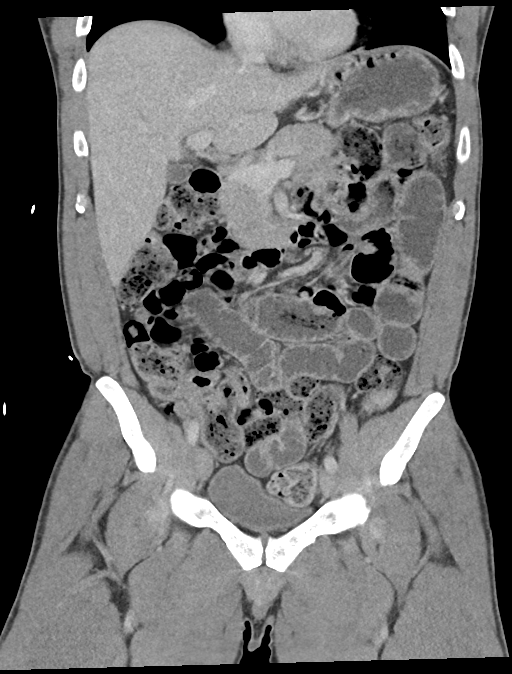

[16 of 46 positions shown; findings below may reference images not displayed]

FINDINGS: Lower chest: No acute abnormality.

Hepatobiliary: No focal liver abnormality is seen. No gallstones,
gallbladder wall thickening, or biliary dilatation.

Pancreas: No pancreatic ductal dilatation or surrounding
inflammatory changes.

Spleen: Normal in size without focal abnormality.

Adrenals/Urinary Tract: Adrenal glands are unremarkable. Kidneys are
normal, without renal calculi, focal lesion, or hydronephrosis.
Bladder is unremarkable.

Stomach/Bowel:

Stomach is within normal limits.

Mild, relative central small bowel thickening and mild distention.
No discrete transition. Fecalization of the distal small bowel.

No evidence of bowel perforation.

Central mesenteric whirlpool sign.  See key image.

Appendix is not definitively visualized.  Nondilated colon.

Vascular/Lymphatic: No enlarged abdominal or pelvic lymph nodes.

Reproductive: Prostate is unremarkable.

Other: No abdominal wall hernia or abnormality. No abdominopelvic
ascites.

Musculoskeletal: No acute or significant osseous findings.
IMPRESSION: Central small bowel thickening and distention, in a context of
central mesenteric "whirlpool" sign.

Findings suspicious for midgut volvulus. No evidence of bowel
perforation.

These results will be called to the ordering clinician or
representative by the Radiologist Assistant, and communication
documented in the PACS or [REDACTED].
